# Patient Record
Sex: Female | Born: 1960 | Race: White | Hispanic: No | Marital: Single | Smoking: Current every day smoker
Health system: Southern US, Community
[De-identification: ages and names within clinical notes are randomized; demographics above are authoritative.]

## PROBLEM LIST (undated history)

## (undated) DIAGNOSIS — M1990 Unspecified osteoarthritis, unspecified site: Secondary | ICD-10-CM

## (undated) DIAGNOSIS — J45909 Unspecified asthma, uncomplicated: Secondary | ICD-10-CM

## (undated) DIAGNOSIS — C801 Malignant (primary) neoplasm, unspecified: Secondary | ICD-10-CM

## (undated) DIAGNOSIS — F419 Anxiety disorder, unspecified: Secondary | ICD-10-CM

## (undated) DIAGNOSIS — I219 Acute myocardial infarction, unspecified: Secondary | ICD-10-CM

## (undated) DIAGNOSIS — IMO0002 Reserved for concepts with insufficient information to code with codable children: Secondary | ICD-10-CM

## (undated) DIAGNOSIS — E559 Vitamin D deficiency, unspecified: Secondary | ICD-10-CM

## (undated) DIAGNOSIS — I10 Essential (primary) hypertension: Secondary | ICD-10-CM

## (undated) DIAGNOSIS — M25559 Pain in unspecified hip: Secondary | ICD-10-CM

## (undated) DIAGNOSIS — L853 Xerosis cutis: Secondary | ICD-10-CM

## (undated) DIAGNOSIS — E119 Type 2 diabetes mellitus without complications: Secondary | ICD-10-CM

## (undated) DIAGNOSIS — J449 Chronic obstructive pulmonary disease, unspecified: Secondary | ICD-10-CM

## (undated) DIAGNOSIS — R9431 Abnormal electrocardiogram [ECG] [EKG]: Secondary | ICD-10-CM

## (undated) DIAGNOSIS — G8929 Other chronic pain: Secondary | ICD-10-CM

## (undated) DIAGNOSIS — E785 Hyperlipidemia, unspecified: Secondary | ICD-10-CM

## (undated) DIAGNOSIS — T7840XA Allergy, unspecified, initial encounter: Secondary | ICD-10-CM

## (undated) HISTORY — DX: Abnormal electrocardiogram (ECG) (EKG): R94.31

## (undated) HISTORY — DX: Type 2 diabetes mellitus without complications: E11.9

## (undated) HISTORY — DX: Xerosis cutis: L85.3

## (undated) HISTORY — DX: Chronic obstructive pulmonary disease, unspecified: J44.9

## (undated) HISTORY — DX: Hyperlipidemia, unspecified: E78.5

## (undated) HISTORY — DX: Unspecified asthma, uncomplicated: J45.909

## (undated) HISTORY — DX: Acute myocardial infarction, unspecified: I21.9

## (undated) HISTORY — DX: Essential (primary) hypertension: I10

## (undated) HISTORY — DX: Allergy, unspecified, initial encounter: T78.40XA

## (undated) HISTORY — DX: Anxiety disorder, unspecified: F41.9

## (undated) HISTORY — PX: STERIOD INJECTION: SHX5046

## (undated) HISTORY — PX: TUBAL LIGATION: SHX77

## (undated) HISTORY — DX: Reserved for concepts with insufficient information to code with codable children: IMO0002

## (undated) HISTORY — DX: Malignant (primary) neoplasm, unspecified: C80.1

## (undated) HISTORY — DX: Vitamin D deficiency, unspecified: E55.9

## (undated) HISTORY — DX: Unspecified osteoarthritis, unspecified site: M19.90

---

## 2009-09-19 HISTORY — PX: UPPER GASTROINTESTINAL ENDOSCOPY: SHX188

## 2010-01-13 ENCOUNTER — Emergency Department (HOSPITAL_COMMUNITY): Admission: EM | Admit: 2010-01-13 | Discharge: 2010-01-13 | Payer: Self-pay | Admitting: Emergency Medicine

## 2010-01-18 ENCOUNTER — Ambulatory Visit: Payer: Self-pay | Admitting: Family Medicine

## 2010-02-05 ENCOUNTER — Ambulatory Visit (HOSPITAL_COMMUNITY): Admission: RE | Admit: 2010-02-05 | Discharge: 2010-02-05 | Payer: Self-pay | Admitting: Internal Medicine

## 2010-02-28 ENCOUNTER — Ambulatory Visit: Payer: Self-pay | Admitting: Internal Medicine

## 2010-02-28 ENCOUNTER — Encounter (INDEPENDENT_AMBULATORY_CARE_PROVIDER_SITE_OTHER): Payer: Self-pay | Admitting: Family Medicine

## 2010-02-28 LAB — CONVERTED CEMR LAB
ALT: 23 units/L (ref 0–35)
AST: 14 units/L (ref 0–37)
Albumin: 4.5 g/dL (ref 3.5–5.2)
Alkaline Phosphatase: 65 units/L (ref 39–117)
BUN: 14 mg/dL (ref 6–23)
Basophils Absolute: 0 10*3/uL (ref 0.0–0.1)
Basophils Relative: 1 % (ref 0–1)
CO2: 26 meq/L (ref 19–32)
Calcium: 9.9 mg/dL (ref 8.4–10.5)
Casts: NONE SEEN /lpf
Chloride: 105 meq/L (ref 96–112)
Cholesterol: 187 mg/dL (ref 0–200)
Creatinine, Ser: 0.9 mg/dL (ref 0.40–1.20)
Crystals: NONE SEEN
Eosinophils Absolute: 0.1 10*3/uL (ref 0.0–0.7)
Eosinophils Relative: 1 % (ref 0–5)
Glucose, Bld: 100 mg/dL — ABNORMAL HIGH (ref 70–99)
HCT: 48.6 % — ABNORMAL HIGH (ref 36.0–46.0)
HDL: 45 mg/dL (ref >39)
Hemoglobin: 15.6 g/dL — ABNORMAL HIGH (ref 12.0–15.0)
Hgb A1c MFr Bld: 5.7 % — ABNORMAL HIGH (ref <5.7)
LDL Cholesterol: 101 mg/dL — ABNORMAL HIGH (ref 0–99)
Lymphocytes Relative: 22 % (ref 12–46)
Lymphs Abs: 1.7 10*3/uL (ref 0.7–4.0)
MCHC: 32.1 g/dL (ref 30.0–36.0)
MCV: 96 fL (ref 78.0–100.0)
Monocytes Absolute: 0.5 10*3/uL (ref 0.1–1.0)
Monocytes Relative: 6 % (ref 3–12)
Neutro Abs: 5.7 10*3/uL (ref 1.7–7.7)
Neutrophils Relative %: 71 % (ref 43–77)
Platelets: 193 10*3/uL (ref 150–400)
Potassium: 4.3 meq/L (ref 3.5–5.3)
RBC / HPF: NONE SEEN (ref <3)
RBC: 5.06 M/uL (ref 3.87–5.11)
RDW: 13.5 % (ref 11.5–15.5)
Sodium: 141 meq/L (ref 135–145)
TSH: 1.816 microintl units/mL (ref 0.350–4.500)
Total Bilirubin: 0.5 mg/dL (ref 0.3–1.2)
Total CHOL/HDL Ratio: 4.2
Total Protein: 7.2 g/dL (ref 6.0–8.3)
Triglycerides: 207 mg/dL — ABNORMAL HIGH (ref <150)
VLDL: 41 mg/dL — ABNORMAL HIGH (ref 0–40)
WBC, UA: NONE SEEN cells/hpf (ref <3)
WBC: 8 10*3/uL (ref 4.0–10.5)

## 2010-03-01 ENCOUNTER — Encounter (INDEPENDENT_AMBULATORY_CARE_PROVIDER_SITE_OTHER): Payer: Self-pay | Admitting: Family Medicine

## 2010-05-14 ENCOUNTER — Encounter (INDEPENDENT_AMBULATORY_CARE_PROVIDER_SITE_OTHER): Payer: Self-pay | Admitting: Family Medicine

## 2010-05-14 LAB — CONVERTED CEMR LAB
ALT: 28 units/L (ref 0–35)
AST: 18 units/L (ref 0–37)
Albumin: 4.5 g/dL (ref 3.5–5.2)
Alkaline Phosphatase: 89 units/L (ref 39–117)
BUN: 21 mg/dL (ref 6–23)
CO2: 27 meq/L (ref 19–32)
Calcium: 10.3 mg/dL (ref 8.4–10.5)
Chloride: 106 meq/L (ref 96–112)
Cholesterol: 164 mg/dL (ref 0–200)
Creatinine, Ser: 0.91 mg/dL (ref 0.40–1.20)
Glucose, Bld: 116 mg/dL — ABNORMAL HIGH (ref 70–99)
HDL: 42 mg/dL (ref >39)
LDL Cholesterol: 101 mg/dL — ABNORMAL HIGH (ref 0–99)
Potassium: 5 meq/L (ref 3.5–5.3)
Sodium: 142 meq/L (ref 135–145)
Total Bilirubin: 0.7 mg/dL (ref 0.3–1.2)
Total CHOL/HDL Ratio: 3.9
Total Protein: 7.3 g/dL (ref 6.0–8.3)
Triglycerides: 106 mg/dL (ref <150)
VLDL: 21 mg/dL (ref 0–40)

## 2010-06-19 ENCOUNTER — Ambulatory Visit: Payer: Self-pay | Admitting: Internal Medicine

## 2010-07-16 ENCOUNTER — Encounter (INDEPENDENT_AMBULATORY_CARE_PROVIDER_SITE_OTHER): Payer: Self-pay | Admitting: *Deleted

## 2010-07-16 LAB — CONVERTED CEMR LAB: Microalb, Ur: 0.5 mg/dL (ref 0.00–1.89)

## 2011-05-23 ENCOUNTER — Other Ambulatory Visit: Payer: Self-pay | Admitting: Family Medicine

## 2011-05-23 ENCOUNTER — Other Ambulatory Visit (HOSPITAL_COMMUNITY)
Admission: RE | Admit: 2011-05-23 | Discharge: 2011-05-23 | Disposition: A | Discharge status: Home / Self Care | Payer: Medicaid Other | Source: Ambulatory Visit | Attending: Family Medicine | Admitting: Family Medicine

## 2011-05-23 DIAGNOSIS — R319 Hematuria, unspecified: Secondary | ICD-10-CM | POA: Insufficient documentation

## 2011-06-14 ENCOUNTER — Other Ambulatory Visit (HOSPITAL_COMMUNITY): Payer: Self-pay | Admitting: Family Medicine

## 2011-06-21 ENCOUNTER — Ambulatory Visit (HOSPITAL_COMMUNITY)
Admission: RE | Admit: 2011-06-21 | Discharge: 2011-06-21 | Disposition: A | Discharge status: Home / Self Care | Payer: Self-pay | Source: Ambulatory Visit | Attending: Family Medicine | Admitting: Family Medicine

## 2011-06-21 DIAGNOSIS — R319 Hematuria, unspecified: Secondary | ICD-10-CM | POA: Insufficient documentation

## 2012-08-19 HISTORY — PX: MULTIPLE TOOTH EXTRACTIONS: SHX2053

## 2015-01-13 ENCOUNTER — Ambulatory Visit (INDEPENDENT_AMBULATORY_CARE_PROVIDER_SITE_OTHER): Payer: Medicaid Other | Admitting: Podiatry

## 2015-01-13 ENCOUNTER — Encounter: Payer: Self-pay | Admitting: Podiatry

## 2015-01-13 VITALS — BP 116/74 | HR 69 | Temp 99.0°F | Resp 14

## 2015-01-13 DIAGNOSIS — M7989 Other specified soft tissue disorders: Secondary | ICD-10-CM | POA: Diagnosis not present

## 2015-01-13 DIAGNOSIS — M79669 Pain in unspecified lower leg: Secondary | ICD-10-CM | POA: Diagnosis not present

## 2015-01-13 DIAGNOSIS — M79676 Pain in unspecified toe(s): Secondary | ICD-10-CM

## 2015-01-13 DIAGNOSIS — B351 Tinea unguium: Secondary | ICD-10-CM

## 2015-01-13 DIAGNOSIS — L600 Ingrowing nail: Secondary | ICD-10-CM

## 2015-01-13 NOTE — Progress Notes (Signed)
   Subjective:    Patient ID: Victoria Rose, female    DOB: [birth year], 54 y.o.   MRN: [id number]  HPI Patient presents today with left great toe (medial side), since 2 months ago, gradual appearing. No treatments done.   Review of Systems  Constitutional: Positive for unexpected weight change.       Sweating  Eyes: Positive for visual disturbance.  Musculoskeletal: Positive for back pain.       Joint pain       Objective:   Physical Exam GENERAL APPEARANCE: Alert, conversant. Appropriately groomed. No acute distress.  VASCULAR: Pedal pulses palpable at 2/4 DP and PT bilateral.  Capillary refill time is immediate to all digits,  Proximal to distal cooling it warm to warm.  Digital hair growth is present bilateral  NEUROLOGIC: sensation is intact epicritically and protectively to 5.07 monofilament at 5/5 sites bilateral.  Light touch is intact bilateral, vibratory sensation intact bilateral, achilles tendon reflex is intact bilateral.  MUSCULOSKELETAL: acceptable muscle strength, tone and stability bilateral.  Intrinsic muscluature intact bilateral.  Rectus appearance of foot and digits noted bilateral.   DERMATOLOGIC: skin color, texture, and turger are within normal limits.  No preulcerative lesions are seen, no interdigital maceration noted.  No open lesions present.  Digital nails are asymptomatic.   NAIL;  Patient has thick, disfigured, discolored nails 1-5 bilat. Marked incurvation noted. Pincer nails hallux bilateral.  Redness and swelling medial border left halux      Assessment & Plan:  Ingrown nail left hallux, onychomycosis x 10  Treatment options and alternatives discussed.  Recommended permanent phenol matr ixectomy and patient agreed.  left was prepped with alcohol and a 1 to 1 mix of 0.5% marcaine plain and 2% lidocaine plain was administered in a digital block fashion.  The toe was then prepped with betadine solution and exsanguinated.  The offending nail border  was then excised and matrix tissue exposed.  Phenol was then applied to the matrix tissue followed by an alcohol wash.  Antibiotic ointment and a dry sterile dressing was applied.  The patient was dispensed instructions for aftercare.  Remainder of mycotic nails debrided x 10.

## 2015-01-19 ENCOUNTER — Encounter: Payer: Self-pay | Admitting: Podiatry

## 2015-01-19 ENCOUNTER — Ambulatory Visit (INDEPENDENT_AMBULATORY_CARE_PROVIDER_SITE_OTHER): Payer: Medicaid Other | Admitting: Podiatry

## 2015-01-19 DIAGNOSIS — Z09 Encounter for follow-up examination after completed treatment for conditions other than malignant neoplasm: Secondary | ICD-10-CM

## 2015-01-19 NOTE — Progress Notes (Signed)
Subjective:    Patient ID: Victoria Rose, female   DOB: September 14, 1960, 54 y.o.   MRN: [id number]  HPI   Patient presents to the office saying her nail is markedly better and her pain is gone.  She has soaked and bandaged her toe as described.  She is pleased with progress.  Review of Systems   Objective:  Physical Exam   No signs of redness or swelling or drainage from site of nail surgery. No palpable pain noted.   Assessment:    Healing nail Surgery   Plan:   Continue home instructions.  Call as needed.

## 2015-01-19 NOTE — Progress Notes (Signed)
Patient ID: Victoria Rose, female   DOB: 12/19/1960, 54 y.o.   MRN: [id number]

## 2015-04-21 ENCOUNTER — Other Ambulatory Visit: Payer: Self-pay | Admitting: Physician Assistant

## 2015-04-21 ENCOUNTER — Ambulatory Visit
Admission: RE | Admit: 2015-04-21 | Discharge: 2015-04-21 | Disposition: A | Discharge status: Home / Self Care | Payer: Medicaid Other | Source: Ambulatory Visit | Attending: Physician Assistant | Admitting: Physician Assistant

## 2015-04-21 DIAGNOSIS — R52 Pain, unspecified: Secondary | ICD-10-CM

## 2015-07-24 DIAGNOSIS — F419 Anxiety disorder, unspecified: Secondary | ICD-10-CM

## 2015-07-24 HISTORY — DX: Anxiety disorder, unspecified: F41.9

## 2015-07-28 DIAGNOSIS — E559 Vitamin D deficiency, unspecified: Secondary | ICD-10-CM | POA: Insufficient documentation

## 2015-10-19 ENCOUNTER — Emergency Department (HOSPITAL_COMMUNITY): Payer: Medicaid Other

## 2015-10-19 ENCOUNTER — Emergency Department (HOSPITAL_COMMUNITY)
Admission: EM | Admit: 2015-10-19 | Discharge: 2015-10-19 | Disposition: A | Discharge status: Home / Self Care | Payer: Medicaid Other | Attending: Emergency Medicine | Admitting: Emergency Medicine

## 2015-10-19 ENCOUNTER — Encounter (HOSPITAL_COMMUNITY): Payer: Self-pay | Admitting: *Deleted

## 2015-10-19 DIAGNOSIS — R531 Weakness: Secondary | ICD-10-CM | POA: Diagnosis present

## 2015-10-19 DIAGNOSIS — Z79899 Other long term (current) drug therapy: Secondary | ICD-10-CM | POA: Diagnosis not present

## 2015-10-19 DIAGNOSIS — I959 Hypotension, unspecified: Secondary | ICD-10-CM | POA: Insufficient documentation

## 2015-10-19 DIAGNOSIS — E785 Hyperlipidemia, unspecified: Secondary | ICD-10-CM | POA: Diagnosis not present

## 2015-10-19 DIAGNOSIS — R55 Syncope and collapse: Secondary | ICD-10-CM

## 2015-10-19 DIAGNOSIS — E119 Type 2 diabetes mellitus without complications: Secondary | ICD-10-CM | POA: Insufficient documentation

## 2015-10-19 DIAGNOSIS — M169 Osteoarthritis of hip, unspecified: Secondary | ICD-10-CM | POA: Diagnosis not present

## 2015-10-19 DIAGNOSIS — I10 Essential (primary) hypertension: Secondary | ICD-10-CM | POA: Diagnosis not present

## 2015-10-19 DIAGNOSIS — F17210 Nicotine dependence, cigarettes, uncomplicated: Secondary | ICD-10-CM | POA: Diagnosis not present

## 2015-10-19 DIAGNOSIS — Z7984 Long term (current) use of oral hypoglycemic drugs: Secondary | ICD-10-CM | POA: Diagnosis not present

## 2015-10-19 DIAGNOSIS — G8929 Other chronic pain: Secondary | ICD-10-CM | POA: Diagnosis not present

## 2015-10-19 HISTORY — DX: Pain in unspecified hip: M25.559

## 2015-10-19 HISTORY — DX: Other chronic pain: G89.29

## 2015-10-19 LAB — URINE MICROSCOPIC-ADD ON

## 2015-10-19 LAB — CBG MONITORING, ED: Glucose-Capillary: 152 mg/dL — ABNORMAL HIGH (ref 65–99)

## 2015-10-19 LAB — BASIC METABOLIC PANEL
Anion gap: 17 — ABNORMAL HIGH (ref 5–15)
BUN: 17 mg/dL (ref 6–20)
CO2: 21 mmol/L — ABNORMAL LOW (ref 22–32)
Calcium: 9.8 mg/dL (ref 8.9–10.3)
Chloride: 108 mmol/L (ref 101–111)
Creatinine, Ser: 0.9 mg/dL (ref 0.44–1.00)
GFR calc Af Amer: >60 mL/min (ref >60)
GFR calc non Af Amer: >60 mL/min (ref >60)
Glucose, Bld: 180 mg/dL — ABNORMAL HIGH (ref 65–99)
Potassium: 3.7 mmol/L (ref 3.5–5.1)
Sodium: 146 mmol/L — ABNORMAL HIGH (ref 135–145)

## 2015-10-19 LAB — URINALYSIS, ROUTINE W REFLEX MICROSCOPIC
Bilirubin Urine: NEGATIVE
Glucose, UA: NEGATIVE mg/dL
Hgb urine dipstick: NEGATIVE
Ketones, ur: NEGATIVE mg/dL
Leukocytes, UA: NEGATIVE
Nitrite: NEGATIVE
Protein, ur: 30 mg/dL — AB
Specific Gravity, Urine: 1.023 (ref 1.005–1.030)
pH: 5.5 (ref 5.0–8.0)

## 2015-10-19 LAB — CBC
HCT: 50.2 % — ABNORMAL HIGH (ref 36.0–46.0)
Hemoglobin: 16.3 g/dL — ABNORMAL HIGH (ref 12.0–15.0)
MCH: 29.8 pg (ref 26.0–34.0)
MCHC: 32.5 g/dL (ref 30.0–36.0)
MCV: 91.8 fL (ref 78.0–100.0)
Platelets: 294 10*3/uL (ref 150–400)
RBC: 5.47 MIL/uL — ABNORMAL HIGH (ref 3.87–5.11)
RDW: 14.4 % (ref 11.5–15.5)
WBC: 14.6 10*3/uL — ABNORMAL HIGH (ref 4.0–10.5)

## 2015-10-19 LAB — D-DIMER, QUANTITATIVE: D-Dimer, Quant: 1.05 ug/mL-FEU — ABNORMAL HIGH (ref 0.00–0.50)

## 2015-10-19 MED ORDER — IOHEXOL 350 MG/ML SOLN
80.0000 mL | Freq: Once | INTRAVENOUS | Status: AC | PRN
  Administered 2015-10-19: 80 mL via INTRAVENOUS

## 2015-10-19 MED ORDER — SODIUM CHLORIDE 0.9 % IV BOLUS (SEPSIS)
1000.0000 mL | Freq: Once | INTRAVENOUS | Status: AC
  Administered 2015-10-19: 1000 mL via INTRAVENOUS

## 2015-10-19 NOTE — ED Provider Notes (Signed)
CSN: [id number]     Arrival date & time 10/19/15  1719 History   First MD Initiated Contact with Patient 10/19/15 1846     Chief Complaint  Patient presents with  . Fall  . Weakness  . Dizziness     (Consider location/radiation/quality/duration/timing/severity/associated sxs/prior Treatment) HPI  55 year old female presents with syncope. Patient states she was walking from bathroom to living room and passed out suddenly. She had been walking for about 40 feet. No lightheadedness. No CP, dyspnea or headache before or after. No vomiting or diarrhea. Has not been ill recently. No urinary symptoms. No cough or fever. Did not hit head. Father said she was out for a few seconds. Does not remember having any palpitations.   Past Medical History  Diagnosis Date  . Diabetes mellitus without complication ([place])   . Hyperlipidemia   . Hypertension   . Arthritis     hip  . Chronic hip pain    History reviewed. No pertinent past surgical history. History reviewed. No pertinent family history. Social History  Substance Use Topics  . Smoking status: Current Every Day Smoker -- 0.50 packs/day    Types: Cigarettes  . Smokeless tobacco: Never Used  . Alcohol Use: 0.0 oz/week    0 Standard drinks or equivalent per week     Comment: occasionally   OB History    No data available     Review of Systems  Constitutional: Negative for fever.  Respiratory: Negative for cough and shortness of breath.   Cardiovascular: Negative for chest pain.  Gastrointestinal: Negative for nausea, vomiting and diarrhea.  Genitourinary: Negative for dysuria.  Neurological: Positive for syncope. Negative for light-headedness and headaches.  All other systems reviewed and are negative.     Allergies  Aspirin and Nsaids  Home Medications   Prior to Admission medications   Medication Sig Start Date End Date Taking? Authorizing Provider  amitriptyline (ELAVIL) 50 MG tablet  01/05/15   Historical Provider, MD   atorvastatin (LIPITOR) 20 MG tablet Take 20 mg by mouth daily. 10/28/14   Historical Provider, MD  citalopram (CELEXA) 40 MG tablet  12/19/14   Historical Provider, MD  cyclobenzaprine (FLEXERIL) 10 MG tablet  12/14/14   Historical Provider, MD  HYDROcodone-acetaminophen [facility]) 10-325 MG per tablet  12/20/14   Historical Provider, MD  lisinopril-hydrochlorothiazide (PRINZIDE,ZESTORETIC) 20-25 MG per tablet  12/19/14   Historical Provider, MD  LORazepam (ATIVAN) 1 MG tablet  12/19/14   Historical Provider, MD  metFORMIN (GLUCOPHAGE) 500 MG tablet  12/19/14   Historical Provider, MD  metoprolol (LOPRESSOR) 50 MG tablet  12/19/14   Historical Provider, MD  PROAIR HFA 108 (90 BASE) MCG/ACT inhaler  01/05/15   Historical Provider, MD   BP 101/66 mmHg  Pulse 62  Temp(Src) 98.7 F (37.1 C) (Oral)  Resp 27  Ht 5\' 4"  (1.626 m)  Wt 158 lb (71.668 kg)  BMI 27.11 kg/m2  SpO2 92% Physical Exam  Constitutional: She is oriented to person, place, and time. She appears well-developed and well-nourished.  HENT:  Head: Normocephalic and atraumatic.  Right Ear: External ear normal.  Left Ear: External ear normal.  Nose: Nose normal.  Eyes: Right eye exhibits no discharge. Left eye exhibits no discharge.  Cardiovascular: Normal rate, regular rhythm and normal heart sounds.   Pulmonary/Chest: Effort normal and breath sounds normal.  Abdominal: Soft. She exhibits no distension. There is no tenderness.  Neurological: She is alert and oriented to person, place, and time.  CN 2-12 grossly intact. 5/5 strength in all 4 extremities. Normal finger to nose  Skin: Skin is warm and dry.  Nursing note and vitals reviewed.   ED Course  Procedures (including critical care time) Labs Review Labs Reviewed  BASIC METABOLIC PANEL - Abnormal; Notable for the following:    Sodium 146 (*)    CO2 21 (*)    Glucose, Bld 180 (*)    Anion gap 17 (*)    All other components within normal limits  CBC - Abnormal; Notable for the  following:    WBC 14.6 (*)    RBC 5.47 (*)    Hemoglobin 16.3 (*)    HCT 50.2 (*)    All other components within normal limits  URINALYSIS, ROUTINE W REFLEX MICROSCOPIC (NOT AT [facility]) - Abnormal; Notable for the following:    APPearance CLOUDY (*)    Protein, ur 30 (*)    All other components within normal limits  D-DIMER, QUANTITATIVE (NOT AT [facility]) - Abnormal; Notable for the following:    D-Dimer, Quant 1.05 (*)    All other components within normal limits  URINE MICROSCOPIC-ADD ON - Abnormal; Notable for the following:    Squamous Epithelial / LPF 0-5 (*)    Bacteria, UA RARE (*)    Casts HYALINE CASTS (*)    All other components within normal limits  CBG MONITORING, ED - Abnormal; Notable for the following:    Glucose-Capillary 152 (*)    All other components within normal limits    Imaging Review Dg Chest 2 View  10/19/2015  CLINICAL DATA:  Syncopal episode EXAM: CHEST  2 VIEW COMPARISON:  None. FINDINGS: The heart size and mediastinal contours are within normal limits. Both lungs are clear. The visualized skeletal structures are unremarkable. IMPRESSION: No active cardiopulmonary disease. Electronically Signed   By: Inez Catalina M.D.   On: 10/19/2015 20:18   Ct Angio Chest Pe W/cm &/or Wo Cm  10/19/2015  CLINICAL DATA:  Syncopal episodes with elevated D-dimer EXAM: CT ANGIOGRAPHY CHEST WITH CONTRAST TECHNIQUE: Multidetector CT imaging of the chest was performed using the standard protocol during bolus administration of intravenous contrast. Multiplanar CT image reconstructions and MIPs were obtained to evaluate the vascular anatomy. CONTRAST:  45mL OMNIPAQUE IOHEXOL 350 MG/ML SOLN COMPARISON:  None. FINDINGS: The lungs are well aerated bilaterally. No focal infiltrate or sizable effusion is seen. Minimal dependent atelectatic changes are noted particularly on the right. The thoracic inlet is within normal limits. The thoracic aorta and its branches are within normal limits. The  pulmonary artery is well visualized and demonstrates a normal branching pattern. No intraluminal filling defects to suggest pulmonary emboli are identified. No significant hilar or mediastinal adenopathy is seen. Minimal coronary calcifications are noted. The visualized upper abdomen shows no acute abnormality. The osseous structures show no acute abnormality. Review of the MIP images confirms the above findings. IMPRESSION: No evidence of pulmonary emboli. Minimal dependent atelectatic changes. Electronically Signed   By: Inez Catalina M.D.   On: 10/19/2015 21:50   I have personally reviewed and evaluated these images and lab results as part of my medical decision-making.   EKG Interpretation   Date/Time:  Thursday October 19 2015 18:02:40 EST Ventricular Rate:  65 PR Interval:  122 QRS Duration: 84 QT Interval:  428 QTC Calculation: 445 R Axis:   52 Text Interpretation:  Normal sinus rhythm Normal ECG No old tracing to  compare Confirmed by Kohei Antonellis  MD, [place] [phone]) on 10/19/2015  6:17:05 PM      MDM   Final diagnoses:  Syncope, unspecified syncope type  Hypotension, unspecified hypotension type    Patient syncope was likely due to low blood pressure. Unclear exactly why she had low blood pressure although will hold her antihypertensives. Her heart rate has remained stable here. Given initial oxygen saturations of 92% a workup for PE was obtained and is negative. Her neuro exam is unremarkable. She feels completely fine now. Initial nurse report indicates she was dizzy and has been expressing weakness below and I discussed this with patient she denies this. He only symptom was the syncope. She feels quite well now. Blood pressure is normal after IV fluids. No signs of infection or sepsis on history or physical. I offered observation admission for telemetry monitoring but patient declines. She is not have any high risk features for a dysrhythmia such as CHF. She wants to go home and will call  her PCP about her high blood pressure medicines. Discussed strict return precautions.    Sherwood Gambler, MD 10/20/15 [phone]

## 2015-10-19 NOTE — Discharge Instructions (Signed)
Do not take your lisinopril-hydrochlorothiazide (PRINZIDE,ZESTORETIC) or metoprolol (LOPRESSOR) tomorrow. Check your blood pressure and call your doctor in the morning for close follow up and blood pressure instructions. If you pass out again, feel palpitations, chest pain, or other concerning symptoms then return to the ER immediately.

## 2015-10-19 NOTE — ED Notes (Signed)
Patient transported to X-ray 

## 2015-10-19 NOTE — ED Notes (Signed)
Pt reports that she "fell out" earlier today, pt denies LOC, states she felt dizzy/lightheaded and experiencing generalized weakness. Pt denies chest pain or shortness of breath. Pt admits to left ear discomfort as well.

## 2015-10-19 NOTE — ED Notes (Signed)
Pt taken to CT.

## 2016-12-27 ENCOUNTER — Other Ambulatory Visit: Payer: Self-pay | Admitting: Pain Medicine

## 2016-12-27 DIAGNOSIS — M545 Low back pain: Secondary | ICD-10-CM

## 2017-01-04 ENCOUNTER — Ambulatory Visit
Admission: RE | Admit: 2017-01-04 | Discharge: 2017-01-04 | Disposition: A | Discharge status: Home / Self Care | Payer: Medicaid Other | Source: Ambulatory Visit | Attending: Pain Medicine | Admitting: Pain Medicine

## 2017-01-04 DIAGNOSIS — M545 Low back pain: Secondary | ICD-10-CM

## 2017-04-18 IMAGING — CT CT ANGIO CHEST
2 of 6 series · 19 of 36 positions shown · IV contrast (Omni 300)
Comparison: None.

CLINICAL DATA: Syncopal episodes with elevated D-dimer

EXAM:
CT ANGIOGRAPHY CHEST WITH CONTRAST
TECHNIQUE: Multidetector CT imaging of the chest was performed using the
standard protocol during bolus administration of intravenous
contrast. Multiplanar CT image reconstructions and MIPs were
obtained to evaluate the vascular anatomy.
CONTRAST:  80mL OMNIPAQUE IOHEXOL 350 MG/ML SOLN

[Series 6: pe thins · axial · 0.62mm/px · z∈[+1783,+2020]mm · 18 of 524 slices shown]
[im 25/524  lung]
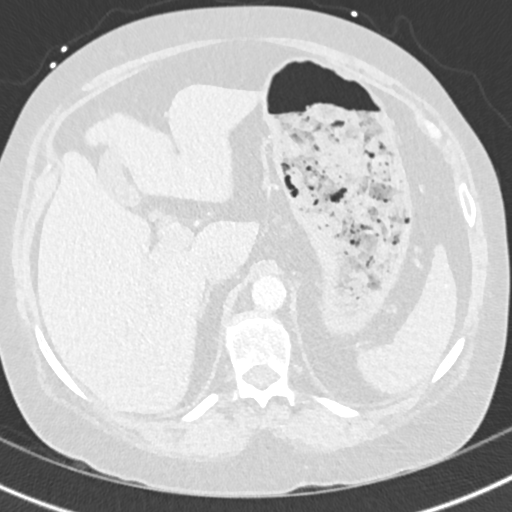
[im 50/524  mediastinal]
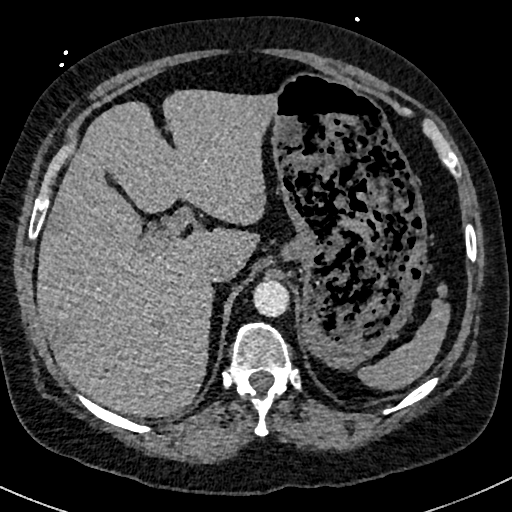
[im 75/524  lung]
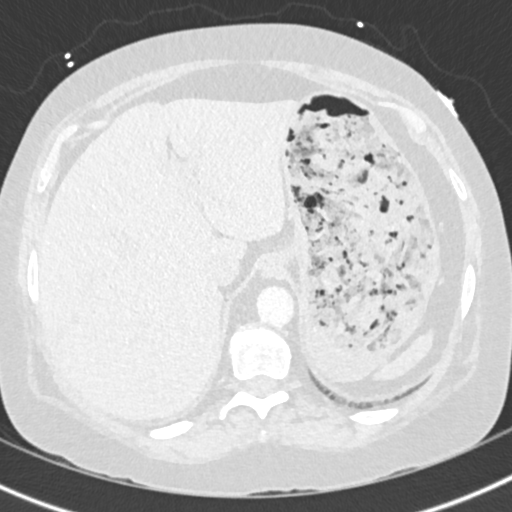
[im 100/524  mediastinal]
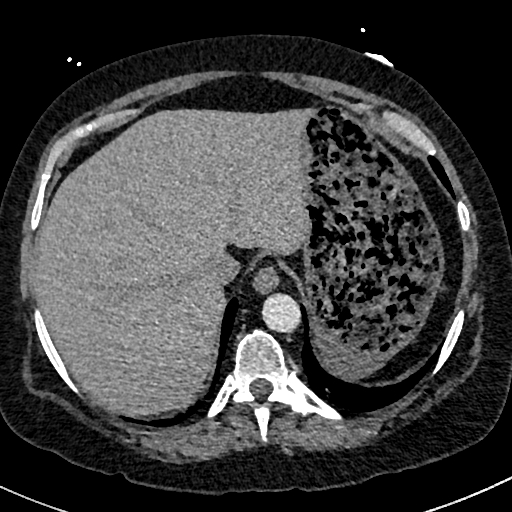
[im 150/524  lung]
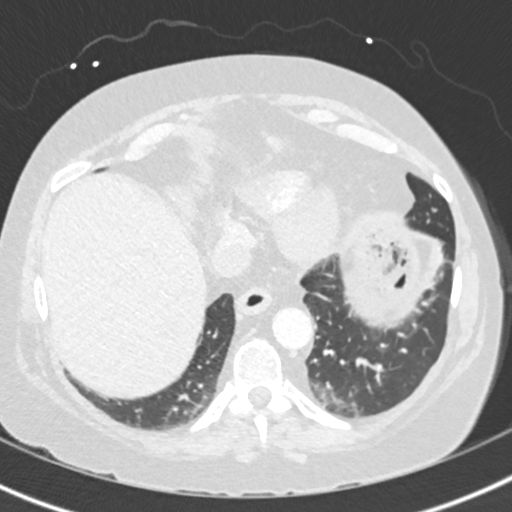
[im 175/524  mediastinal]
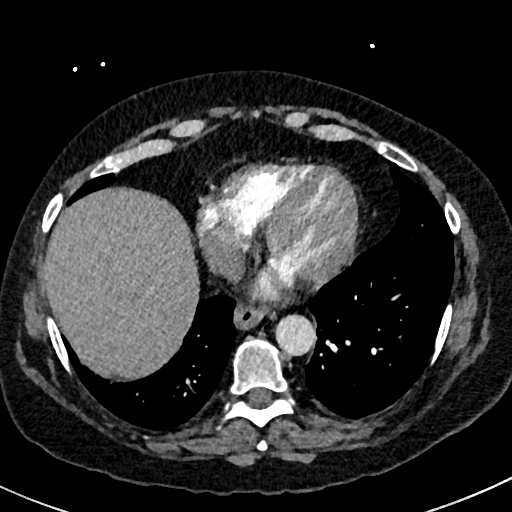
[im 200/524  lung]
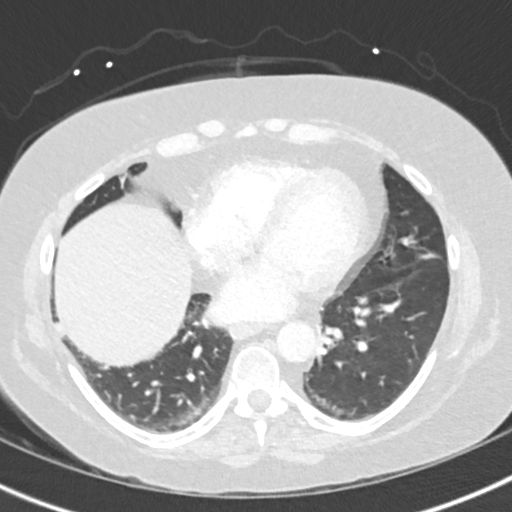
[im 225/524  mediastinal]
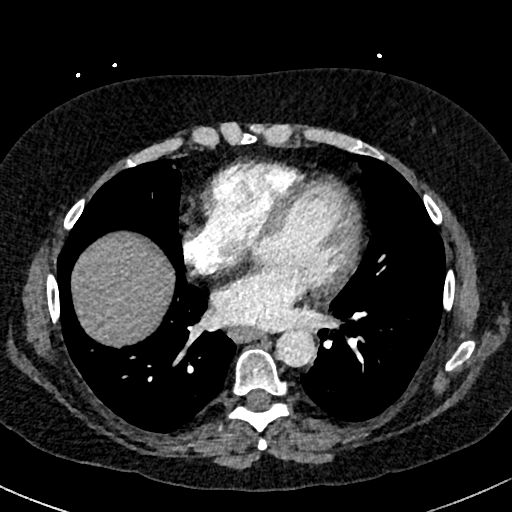
[im 250/524  lung]
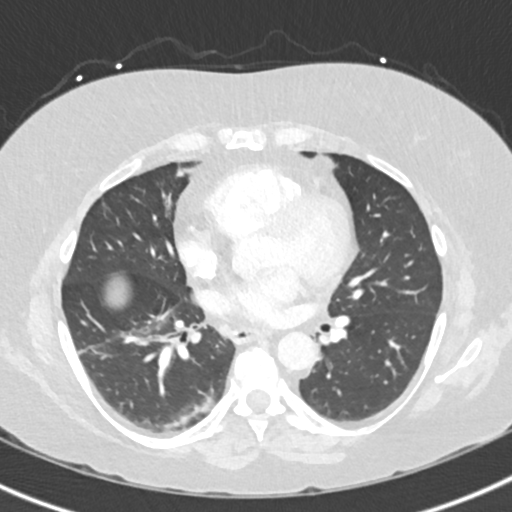
[im 274/524  mediastinal]
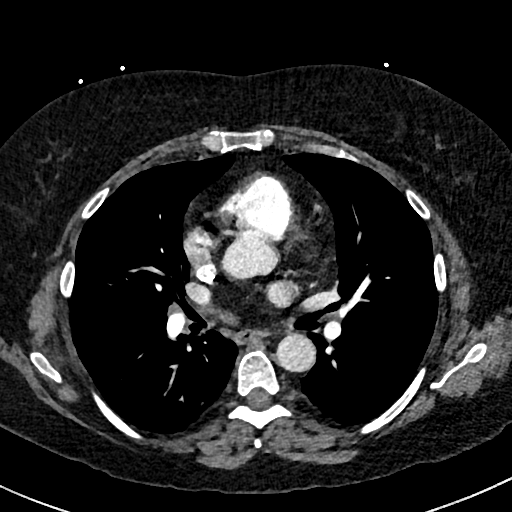
[im 299/524  lung]
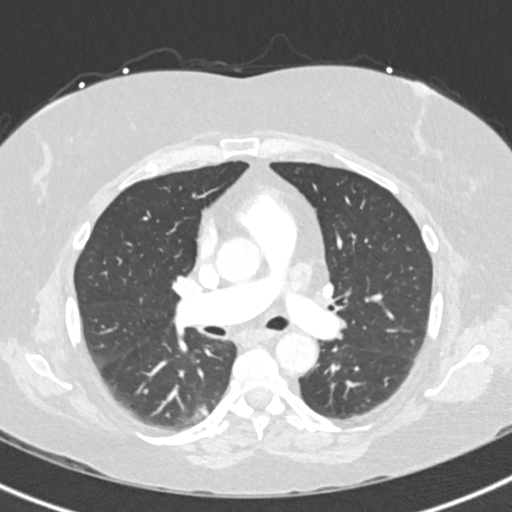
[im 324/524  mediastinal]
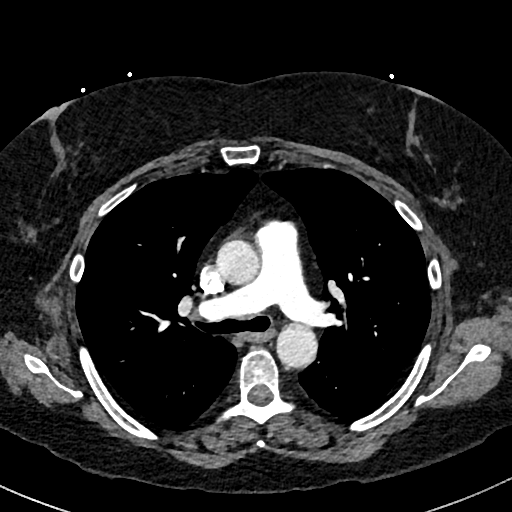
[im 349/524  lung]
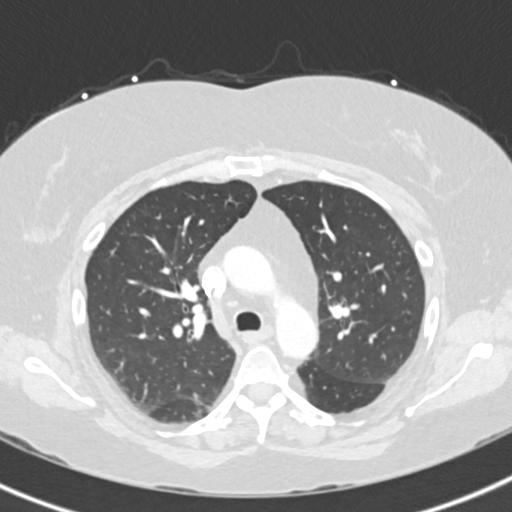
[im 399/524  mediastinal]
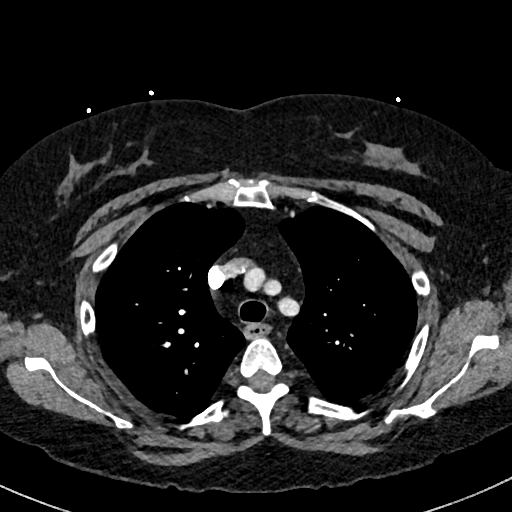
[im 424/524  lung]
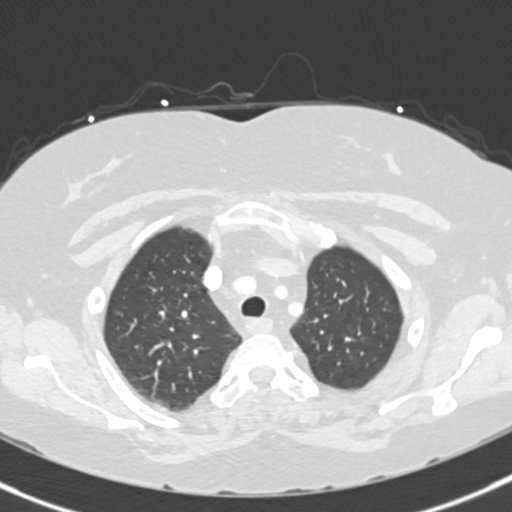
[im 449/524  mediastinal]
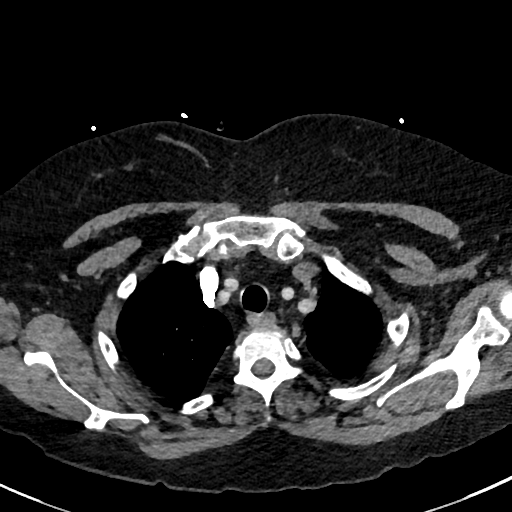
[im 474/524  lung]
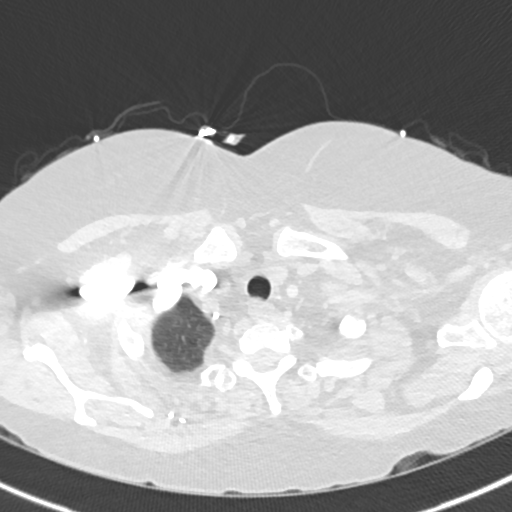
[im 499/524  mediastinal]
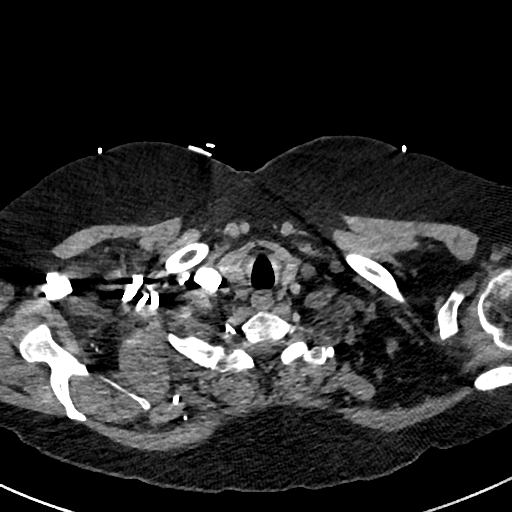

[Series 7: pe 2mm cor · coronal · 0.59mm/px · 1 of 109 slices shown]
[im 55/109  mediastinal]
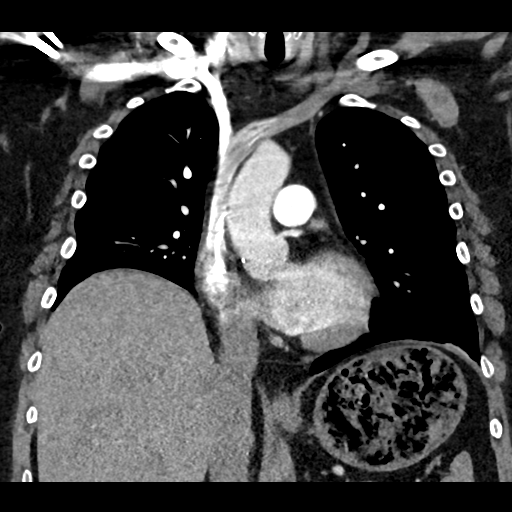

[19 of 36 positions shown; findings below may reference images not displayed]

FINDINGS: The lungs are well aerated bilaterally. No focal infiltrate or
sizable effusion is seen. Minimal dependent atelectatic changes are
noted particularly on the right.

The thoracic inlet is within normal limits. The thoracic aorta and
its branches are within normal limits. The pulmonary artery is well
visualized and demonstrates a normal branching pattern. No
intraluminal filling defects to suggest pulmonary emboli are
identified. No significant hilar or mediastinal adenopathy is seen.
Minimal coronary calcifications are noted.

The visualized upper abdomen shows no acute abnormality. The osseous
structures show no acute abnormality.

Review of the MIP images confirms the above findings.
IMPRESSION: No evidence of pulmonary emboli.

Minimal dependent atelectatic changes.

## 2017-10-15 ENCOUNTER — Encounter: Payer: Self-pay | Admitting: Gastroenterology

## 2017-12-01 ENCOUNTER — Other Ambulatory Visit: Payer: Self-pay

## 2017-12-01 ENCOUNTER — Ambulatory Visit (AMBULATORY_SURGERY_CENTER): Payer: Self-pay | Admitting: *Deleted

## 2017-12-01 VITALS — Ht 64.0 in | Wt 177.0 lb

## 2017-12-01 DIAGNOSIS — M7070 Other bursitis of hip, unspecified hip: Secondary | ICD-10-CM | POA: Insufficient documentation

## 2017-12-01 DIAGNOSIS — I10 Essential (primary) hypertension: Secondary | ICD-10-CM | POA: Insufficient documentation

## 2017-12-01 DIAGNOSIS — M4800 Spinal stenosis, site unspecified: Secondary | ICD-10-CM

## 2017-12-01 DIAGNOSIS — E781 Pure hyperglyceridemia: Secondary | ICD-10-CM | POA: Insufficient documentation

## 2017-12-01 DIAGNOSIS — Z1211 Encounter for screening for malignant neoplasm of colon: Secondary | ICD-10-CM

## 2017-12-01 DIAGNOSIS — E119 Type 2 diabetes mellitus without complications: Secondary | ICD-10-CM

## 2017-12-01 DIAGNOSIS — E785 Hyperlipidemia, unspecified: Secondary | ICD-10-CM | POA: Insufficient documentation

## 2017-12-01 DIAGNOSIS — R195 Other fecal abnormalities: Secondary | ICD-10-CM

## 2017-12-01 HISTORY — DX: Pure hyperglyceridemia: E78.1

## 2017-12-01 HISTORY — DX: Essential (primary) hypertension: I10

## 2017-12-01 HISTORY — DX: Type 2 diabetes mellitus without complications: E11.9

## 2017-12-01 HISTORY — DX: Other bursitis of hip, unspecified hip: M70.70

## 2017-12-01 HISTORY — DX: Spinal stenosis, site unspecified: M48.00

## 2017-12-01 MED ORDER — NA SULFATE-K SULFATE-MG SULF 17.5-3.13-1.6 GM/177ML PO SOLN: ORAL | 0 refills | Status: DC

## 2017-12-01 NOTE — Progress Notes (Signed)
Patient denies any allergies to eggs or soy. Patient denies any problems with anesthesia/sedation since 1988 C-section, pt states during that c-section "heart had to be shocked 3 times, they gave me too much anesthesia". Patient states she has received anesthesia since then without any problems. Asked Georgina Snell CRNA about this and he said she should be fine if she has had anesthesia without any problems after the c section.Patient denies any oxygen use at home. Patient denies taking any diet/weight loss medications or blood thinners. EMMI education assisgned to patient on colonoscopy, this was explained and instructions given to patient.

## 2017-12-15 ENCOUNTER — Ambulatory Visit (AMBULATORY_SURGERY_CENTER): Payer: Medicaid Other | Admitting: Gastroenterology

## 2017-12-15 ENCOUNTER — Encounter: Payer: Self-pay | Admitting: Gastroenterology

## 2017-12-15 VITALS — BP 115/74 | HR 72 | Temp 98.4°F | Resp 19 | Ht 64.0 in | Wt 177.0 lb

## 2017-12-15 DIAGNOSIS — Z1211 Encounter for screening for malignant neoplasm of colon: Secondary | ICD-10-CM

## 2017-12-15 DIAGNOSIS — K635 Polyp of colon: Secondary | ICD-10-CM | POA: Diagnosis not present

## 2017-12-15 DIAGNOSIS — D122 Benign neoplasm of ascending colon: Secondary | ICD-10-CM | POA: Diagnosis not present

## 2017-12-15 DIAGNOSIS — D123 Benign neoplasm of transverse colon: Secondary | ICD-10-CM

## 2017-12-15 DIAGNOSIS — D128 Benign neoplasm of rectum: Secondary | ICD-10-CM

## 2017-12-15 DIAGNOSIS — D125 Benign neoplasm of sigmoid colon: Secondary | ICD-10-CM

## 2017-12-15 DIAGNOSIS — D129 Benign neoplasm of anus and anal canal: Secondary | ICD-10-CM

## 2017-12-15 DIAGNOSIS — K621 Rectal polyp: Secondary | ICD-10-CM | POA: Diagnosis not present

## 2017-12-15 MED ORDER — SODIUM CHLORIDE 0.9 % IV SOLN: 500.0000 mL | Freq: Once | INTRAVENOUS | Status: DC

## 2017-12-15 NOTE — Progress Notes (Signed)
Called to room to assist during endoscopic procedure.  Patient ID and intended procedure confirmed with present staff. Received instructions for my participation in the procedure from the performing physician.  

## 2017-12-15 NOTE — Patient Instructions (Signed)
Impression/Recommendations:  Polyp handout given to patient. Diverticulosis handout given to patient.  Resume previous diet. Continue present medications.  Repeat colonsocopy recommended.  Date to be determined after pathology results reviewed.  YOU HAD AN ENDOSCOPIC PROCEDURE TODAY AT [facility]:   Refer to the procedure report that was given to you for any specific questions about what was found during the examination.  If the procedure report does not answer your questions, please call your gastroenterologist to clarify.  If you requested that your care partner not be given the details of your procedure findings, then the procedure report has been included in a sealed envelope for you to review at your convenience later.  YOU SHOULD EXPECT: Some feelings of bloating in the abdomen. Passage of more gas than usual.  Walking can help get rid of the air that was put into your GI tract during the procedure and reduce the bloating. If you had a lower endoscopy (such as a colonoscopy or flexible sigmoidoscopy) you may notice spotting of blood in your stool or on the toilet paper. If you underwent a bowel prep for your procedure, you may not have a normal bowel movement for a few days.  Please Note:  You might notice some irritation and congestion in your nose or some drainage.  This is from the oxygen used during your procedure.  There is no need for concern and it should clear up in a day or so.  SYMPTOMS TO REPORT IMMEDIATELY:   Following lower endoscopy (colonoscopy or flexible sigmoidoscopy):  Excessive amounts of blood in the stool  Significant tenderness or worsening of abdominal pains  Swelling of the abdomen that is new, acute  Fever of 100F or higher  For urgent or emergent issues, a gastroenterologist can be reached at any hour by calling [phone].   DIET:  We do recommend a small meal at first, but then you may proceed to your regular diet.  Drink plenty of  fluids but you should avoid alcoholic beverages for 24 hours.  ACTIVITY:  You should plan to take it easy for the rest of today and you should NOT DRIVE or use heavy machinery until tomorrow (because of the sedation medicines used during the test).    FOLLOW UP: Our staff will call the number listed on your records the next business day following your procedure to check on you and address any questions or concerns that you may have regarding the information given to you following your procedure. If we do not reach you, we will leave a message.  However, if you are feeling well and you are not experiencing any problems, there is no need to return our call.  We will assume that you have returned to your regular daily activities without incident.  If any biopsies were taken you will be contacted by phone or by letter within the next 1-3 weeks.  Please call us at [phone] if you have not heard about the biopsies in 3 weeks.    SIGNATURES/CONFIDENTIALITY: You and/or your care partner have signed paperwork which will be entered into your electronic medical record.  These signatures attest to the fact that that the information above on your After Visit Summary has been reviewed and is understood.  Full responsibility of the confidentiality of this discharge information lies with you and/or your care-partner.

## 2017-12-15 NOTE — Op Note (Signed)
[place] Patient Name: Victoria Rose Procedure Date: 12/15/2017 8:32 AM MRN: [id number] Endoscopist: Mauri Pole , MD Age: 57 Referring MD:  Date of Birth: Jun 15, 1961 Gender: Female Account #: [id number] Procedure:                Colonoscopy Indications:              Screening for colorectal malignant neoplasm, This                            is the patient's first colonoscopy Medicines:                Monitored Anesthesia Care Procedure:                Pre-Anesthesia Assessment:                           - Prior to the procedure, a History and Physical                            was performed, and patient medications and                            allergies were reviewed. The patient's tolerance of                            previous anesthesia was also reviewed. The risks                            and benefits of the procedure and the sedation                            options and risks were discussed with the patient.                            All questions were answered, and informed consent                            was obtained. Prior Anticoagulants: The patient has                            taken no previous anticoagulant or antiplatelet                            agents. ASA Grade Assessment: III - A patient with                            severe systemic disease. After reviewing the risks                            and benefits, the patient was deemed in                            satisfactory condition to undergo the procedure.  After obtaining informed consent, the colonoscope                            was passed under direct vision. Throughout the                            procedure, the patient's blood pressure, pulse, and                            oxygen saturations were monitored continuously. The                            Model PCF-[id number]) scope was introduced                            through the  anus and advanced to the the terminal                            ileum, with identification of the appendiceal                            orifice and IC valve. The colonoscopy was performed                            without difficulty. The patient tolerated the                            procedure well. The quality of the bowel                            preparation was good. The terminal ileum, ileocecal                            valve, appendiceal orifice, and rectum were                            photographed. Scope In: 8:38:32 AM Scope Out: 9:02:06 AM Scope Withdrawal Time: 0 hours 20 minutes 53 seconds  Total Procedure Duration: 0 hours 23 minutes 34 seconds  Findings:                 The perianal and digital rectal examinations were                            normal.                           A 8 mm polyp was found in the ascending colon. The                            polyp was sessile. The polyp was removed with a                            cold snare. Resection and retrieval were complete.  A 11 mm polyp was found in the transverse colon.                            The polyp was sessile. The polyp was removed with a                            cold snare. Resection and retrieval were complete.                            Coagulation for hemostasis of bleeding caused by                            the procedure using snare was unsuccessful. To                            prevent bleeding after the polypectomy, one                            hemostatic clip was successfully placed (MR                            conditional). There was no bleeding at the end of                            the procedure.                           A 20 mm polyp was found in the sigmoid colon. The                            polyp was sessile. The polyp was removed with a                            piecemeal technique using a hot snare. Resection                            and  retrieval were complete.                           Two hyperplastic appearing polyps were found in the                            rectum. The polyps were 2 to 5 mm in size. These                            polyps were removed with a cold biopsy forceps.                            Resection and retrieval were complete.                           Scattered small and large-mouthed diverticula were  found in the sigmoid colon and descending colon. Complications:            No immediate complications. Estimated Blood Loss:     Estimated blood loss was minimal. Impression:               - One 8 mm polyp in the ascending colon, removed                            with a cold snare. Resected and retrieved.                           - One 11 mm polyp in the transverse colon, removed                            with a cold snare. Resected and retrieved.                            Treatment not successful. Treated with a hot snare.                            Clip (MR conditional) was placed.                           - One 20 mm polyp in the sigmoid colon, removed                            piecemeal using a hot snare. Resected and retrieved.                           - Two 2 to 5 mm polyps in the rectum, removed with                            a cold biopsy forceps. Resected and retrieved.                           - Mild diverticulosis in the sigmoid colon and in                            the descending colon. Recommendation:           - Patient has a contact number available for                            emergencies. The signs and symptoms of potential                            delayed complications were discussed with the                            patient. Return to normal activities tomorrow.                            Written discharge instructions were provided to the  patient.                           - Resume previous diet.                            - Continue present medications.                           - Await pathology results.                           - Repeat colonoscopy date to be determined after                            pending pathology results are reviewed for                            surveillance based on pathology results. Mauri Pole, MD 12/15/2017 9:11:39 AM This report has been signed electronically.

## 2017-12-15 NOTE — Progress Notes (Signed)
Report given to PACU, vss 

## 2017-12-15 NOTE — Progress Notes (Signed)
Pt's states no medical or surgical changes since previsit or office visit. 

## 2017-12-16 ENCOUNTER — Telehealth: Payer: Self-pay | Admitting: *Deleted

## 2017-12-16 NOTE — Telephone Encounter (Signed)
  Follow up Call-  Call back number 12/15/2017  Post procedure Call Back phone  # [phone]  Permission to leave phone message Yes  Some recent data might be hidden     Patient questions:  Do you have a fever, pain , or abdominal swelling? No. Pain Score  0 *  Have you tolerated food without any problems? Yes.    Have you been able to return to your normal activities? Yes.    Do you have any questions about your discharge instructions: Diet   No. Medications  No. Follow up visit  No.  Do you have questions or concerns about your Care? No.  Actions: * If pain score is 4 or above: No action needed, pain <4.

## 2017-12-19 ENCOUNTER — Encounter: Payer: Self-pay | Admitting: Gastroenterology

## 2018-05-08 DIAGNOSIS — S0990XA Unspecified injury of head, initial encounter: Secondary | ICD-10-CM

## 2018-05-08 HISTORY — DX: Unspecified injury of head, initial encounter: S09.90XA

## 2020-12-18 ENCOUNTER — Encounter: Payer: Self-pay | Admitting: Gastroenterology

## 2021-05-22 ENCOUNTER — Telehealth: Payer: Self-pay | Admitting: Oncology

## 2021-05-22 NOTE — Telephone Encounter (Signed)
Scheduled appt per 10/4 referral. Pt is aware of appt date and time.

## 2021-05-31 ENCOUNTER — Other Ambulatory Visit: Payer: Self-pay | Admitting: Oncology

## 2021-05-31 DIAGNOSIS — D45 Polycythemia vera: Secondary | ICD-10-CM

## 2021-05-31 NOTE — Progress Notes (Signed)
[facility]  [address] [place],  [place]  [address] [phone]  Clinic Day:  06/01/2021  Referring physician: Everardo Beals, NP   HISTORY OF PRESENT ILLNESS:  The patient is a 60 y.o. female  who I was asked to consult upon for polycythemia.  Labs in September 2022 showed an elevated hemoglobin of 18.1.  Her red cell count was also elevated at 6.06 million.  The patient claims she had never been told before of having an elevated hemoglobin level.  She is a heavy smoker.  She claims her mother had hemochromatosis and required phlebotomies to control her iron levels.  The patient has been on a diuretic for 10+ years as part of her blood pressure regimen.  She denies having undergone a previous splenectomy.  She denies being on any type of anabolic steroid, which can cause a secondary polycythemia.  Overall, she denies having any particular changes in her health over these past months.    PAST MEDICAL HISTORY:   Past Medical History:  Diagnosis Date   Arthritis    hips   Chronic hip pain    Diabetes mellitus without complication ([place])    Hyperlipidemia    Hypertension     PAST SURGICAL HISTORY:   Past Surgical History:  Procedure Laterality Date   CESAREAN SECTION  9983,3825   x2;during 1988 c section "has to be shocked 3 times to get heart going"    MULTIPLE TOOTH EXTRACTIONS  2014   under anesthesia all teeth pulled   STERIOD INJECTION     in back under anesthesia    UPPER GASTROINTESTINAL ENDOSCOPY  09/2009   in [place]-bleeding ulcers    CURRENT MEDICATIONS:   Current Outpatient Medications  Medication Sig Dispense Refill   ACCU-CHEK SOFTCLIX LANCETS lancets Accu-Chek Softclix Lancets  TEST twice a day     amitriptyline (ELAVIL) 50 MG tablet Take 50 mg by mouth at bedtime.   0   amLODipine (NORVASC) 5 MG tablet Take 1 tablet by mouth daily.  1   atorvastatin (LIPITOR) 20 MG tablet Take 20 mg by mouth daily.  0   citalopram  (CELEXA) 40 MG tablet Take 40 mg by mouth daily.   0   cyclobenzaprine (AMRIX) 15 MG 24 hr capsule Take 1 tablet by mouth at bedtime.     lisinopril-hydrochlorothiazide (PRINZIDE,ZESTORETIC) 20-25 MG per tablet Take 1 tablet by mouth daily.   0   LORazepam (ATIVAN) 1 MG tablet Take 1 mg by mouth every 8 (eight) hours as needed for anxiety.   0   metFORMIN (GLUCOPHAGE) 500 MG tablet Take 500 mg by mouth daily with breakfast.   0   metoprolol (LOPRESSOR) 50 MG tablet Take 50 mg by mouth 2 (two) times daily.   0   naloxone (NARCAN) nasal spray 4 mg/0.1 mL Narcan 4 mg/actuation nasal spray  CALL 911 ADMINISTER A SINGLE SPRAY OF NARCAN IN 1(ONE) NOSTRIL RE...  (REFER TO PRESCRIPTION NOTES).     oxyCODONE-acetaminophen (PERCOCET) 10-325 MG tablet Take 1 tablet by mouth every 4 (four) hours as needed for pain.   0   PROAIR HFA 108 (90 BASE) MCG/ACT inhaler Inhale 1-2 puffs into the lungs every 4 (four) hours as needed for wheezing or shortness of breath.   0   Vitamin D, Ergocalciferol, (DRISDOL) 50000 units CAPS capsule Take 50,000 Units by mouth once a week.  0   Current Facility-Administered Medications  Medication Dose Route Frequency Provider Last Rate Last Admin  0.9 %  sodium chloride infusion  500 mL Intravenous Once Nandigam, Venia Minks, MD        ALLERGIES:   Allergies  Allergen Reactions   Aspirin Other (See Comments)    Bleeding ulcers   Nsaids Other (See Comments)    Bleeding ulcers    FAMILY HISTORY:  Father died from cirrhosis Mother died from complications of atrial fibrillation 1 sister has COPD  SOCIAL HISTORY:  The patient was born in [place].  She lives in [place] .  She is divorced, with 2 children and 1 grandchild.  She is on disability; she previously did convenient store work for years.  She has smoked a half a pack of cigarettes daily x 45 years.  She used alcohol previously, but has not done so in 20 years.    REVIEW OF SYSTEMS:  Review of Systems   Constitutional:  Negative for fatigue and fever.  HENT:   Negative for hearing loss and sore throat.   Eyes:  Negative for eye problems.  Respiratory:  Negative for chest tightness, cough and hemoptysis.   Cardiovascular:  Negative for chest pain and palpitations.  Gastrointestinal:  Negative for abdominal distention, abdominal pain, blood in stool, constipation, diarrhea, nausea and vomiting.  Endocrine: Negative for hot flashes.  Genitourinary:  Negative for difficulty urinating, dysuria, frequency, hematuria and nocturia.   Musculoskeletal:  Positive for back pain. Negative for arthralgias, gait problem and myalgias.  Skin: Negative.  Negative for itching and rash.  Neurological: Negative.  Negative for dizziness, extremity weakness, gait problem, headaches, light-headedness and numbness.  Hematological: Negative.   Psychiatric/Behavioral:  Negative for depression and suicidal ideas. The patient is nervous/anxious.     PHYSICAL EXAM:  Blood pressure 111/74, pulse 92, temperature 98 F (36.7 C), resp. rate 16, height 5\' 4"  (1.626 m), weight 182 lb 9.6 oz (82.8 kg), SpO2 94 %. Wt Readings from Last 3 Encounters:  06/01/21 182 lb 9.6 oz (82.8 kg)  12/15/17 177 lb (80.3 kg)  12/01/17 177 lb (80.3 kg)   Body mass index is 31.34 kg/m. Performance status (ECOG): 0 - Asymptomatic Physical Exam Constitutional:      Appearance: Normal appearance. She is not ill-appearing.  HENT:     Mouth/Throat:     Mouth: Mucous membranes are moist.     Pharynx: Oropharynx is clear. No oropharyngeal exudate or posterior oropharyngeal erythema.  Cardiovascular:     Rate and Rhythm: Normal rate and regular rhythm.     Heart sounds: No murmur heard.   No friction rub. No gallop.  Pulmonary:     Effort: Pulmonary effort is normal. No respiratory distress.     Breath sounds: Normal breath sounds. No wheezing, rhonchi or rales.  Abdominal:     General: Bowel sounds are normal. There is no distension.      Palpations: Abdomen is soft. There is no mass.     Tenderness: There is no abdominal tenderness.  Musculoskeletal:        General: No swelling.     Right lower leg: No edema.     Left lower leg: No edema.  Lymphadenopathy:     Cervical: No cervical adenopathy.     Upper Body:     Right upper body: No supraclavicular or axillary adenopathy.     Left upper body: No supraclavicular or axillary adenopathy.     Lower Body: No right inguinal adenopathy. No left inguinal adenopathy.  Skin:    General: Skin is warm.  Coloration: Skin is not jaundiced.     Findings: No lesion or rash.  Neurological:     General: No focal deficit present.     Mental Status: She is alert and oriented to person, place, and time. Mental status is at baseline.     Cranial Nerves: Cranial nerves are intact.  Psychiatric:        Mood and Affect: Mood normal.        Behavior: Behavior normal.        Thought Content: Thought content normal.    LABS:   CBC Latest Ref Rng & Units 06/01/2021 10/19/2015 02/28/2010  WBC - 9.2 14.6(H) 8.0  Hemoglobin 12.0 - 16.0 17.1(A) 16.3(H) 15.6(H)  Hematocrit 36 - 46 53(A) 50.2(H) 48.6(H)  Platelets 150 - 399 180 294 193   CMP Latest Ref Rng & Units 10/19/2015 05/14/2010 02/28/2010  Glucose 65 - 99 mg/dL 180(H) 116(H) 100(H)  BUN 6 - 20 mg/dL 17 21 14   Creatinine 0.44 - 1.00 mg/dL 0.90 0.91 0.90  Sodium 135 - 145 mmol/L 146(H) 142 141  Potassium 3.5 - 5.1 mmol/L 3.7 5.0 4.3  Chloride 101 - 111 mmol/L 108 106 105  CO2 22 - 32 mmol/L 21(L) 27 26  Calcium 8.9 - 10.3 mg/dL 9.8 10.3 9.9  Total Protein 6.0 - 8.3 g/dL - 7.3 7.2  Total Bilirubin 0.3 - 1.2 mg/dL - 0.7 0.5  Alkaline Phos 39 - 117 units/L - 89 65  AST 0 - 37 units/L - 18 14  ALT 0 - 35 units/L - 28 23    ASSESSMENT & PLAN:  A 60 y.o. female who I was asked to consult upon for polycythemia.  There is a very good chance her polycythemia is secondary to her numerous years of smoking.  As there is a family history  of hemochromatosis, I will check her iron parameters and perform hemochromatosis mutation testing to ensure this disorder is not behind her polycythemia.  I will also order a JAK2 mutation test to ensure polycythemia rubra vera is not present.  Although elevated, her hemoglobin is not particularly elevated to where immediate intervention is necessary.  I will see her back in 2 weeks to go over all of her labs collected today and their implications. The patient understands all the plans discussed today and is in agreement with them.  I do appreciate Everardo Beals, NP for his new consult.   Sterling Mondo Macarthur Critchley, MD

## 2021-06-01 ENCOUNTER — Inpatient Hospital Stay: Payer: Medicaid Other | Attending: Oncology | Admitting: Oncology

## 2021-06-01 ENCOUNTER — Other Ambulatory Visit: Payer: Self-pay | Admitting: Hematology and Oncology

## 2021-06-01 ENCOUNTER — Telehealth: Payer: Self-pay | Admitting: Oncology

## 2021-06-01 ENCOUNTER — Inpatient Hospital Stay: Payer: Medicaid Other

## 2021-06-01 ENCOUNTER — Encounter: Payer: Self-pay | Admitting: Oncology

## 2021-06-01 ENCOUNTER — Other Ambulatory Visit: Payer: Self-pay | Admitting: Oncology

## 2021-06-01 DIAGNOSIS — D45 Polycythemia vera: Secondary | ICD-10-CM | POA: Diagnosis present

## 2021-06-01 DIAGNOSIS — D751 Secondary polycythemia: Secondary | ICD-10-CM | POA: Diagnosis not present

## 2021-06-01 LAB — IRON AND TIBC
Iron: 82 ug/dL (ref 28–170)
Saturation Ratios: 19 % (ref 10.4–31.8)
TIBC: 429 ug/dL (ref 250–450)
UIBC: 347 ug/dL

## 2021-06-01 LAB — CBC
MCV: 93 (ref 81–99)
RBC: 5.73 — AB (ref 3.87–5.11)

## 2021-06-01 LAB — CBC AND DIFFERENTIAL
HCT: 53 — AB (ref 36–46)
Hemoglobin: 17.1 — AB (ref 12.0–16.0)
Neutrophils Absolute: 5.89
Platelets: 180 (ref 150–399)
WBC: 9.2

## 2021-06-01 LAB — FERRITIN: Ferritin: 33 ng/mL (ref 11–307)

## 2021-06-01 NOTE — Telephone Encounter (Signed)
Per 10/14 LOS, patient scheduled for 10/28 Follow Up.  Gave patient Appt Summary

## 2021-06-01 NOTE — Progress Notes (Unsigned)
Submitted PA request for CPT 28406 (JAK 2) and uploaded clinicals to [facility] web-page; pending review  Confirm Notification/Prior Authorization  Thank you for your online Notification/Prior Authorization submission.  The notification/prior authorization case information was transmitted on 06/01/2021 at 8:28 AM CDT. The notification/prior authorization reference number is [id number].

## 2021-06-03 DIAGNOSIS — D751 Secondary polycythemia: Secondary | ICD-10-CM | POA: Insufficient documentation

## 2021-06-03 HISTORY — DX: Secondary polycythemia: D75.1

## 2021-06-11 NOTE — Progress Notes (Incomplete)
[facility]  [address] [place],  [place]  [address] [phone]  Clinic Day:  06/11/2021  Referring physician: Enid Skeens., MD  This document serves as a record of services personally performed by Dequincy Macarthur Critchley, MD. It was created on their behalf by [facility] E, a trained medical scribe. The creation of this record is based on the scribe's personal observations and the provider's statements to them.  HISTORY OF PRESENT ILLNESS:  The patient is a 60 y.o. female  who I recently began seeing for polycythemia.  Labs in September 2022 showed an elevated hemoglobin of 18.1.  Her red cell count was also elevated at 6.06 million.  The patient claims she had never been told before of having an elevated hemoglobin level.  She is a heavy smoker.  She claims her mother had hemochromatosis and required phlebotomies to control her iron levels.  The patient has been on a diuretic for 10+ years as part of her blood pressure regimen.  She denies having undergone a previous splenectomy.  She denies being on any type of anabolic steroid, which can cause a secondary polycythemia.  Overall, she denies having any particular changes in her health over these past months.    PHYSICAL EXAM:  There were no vitals taken for this visit. Wt Readings from Last 3 Encounters:  06/01/21 182 lb 9.6 oz (82.8 kg)  12/15/17 177 lb (80.3 kg)  12/01/17 177 lb (80.3 kg)   There is no height or weight on file to calculate BMI. Performance status (ECOG): 0 - Asymptomatic Physical Exam Constitutional:      Appearance: Normal appearance. She is not ill-appearing.  HENT:     Mouth/Throat:     Mouth: Mucous membranes are moist.     Pharynx: Oropharynx is clear. No oropharyngeal exudate or posterior oropharyngeal erythema.  Cardiovascular:     Rate and Rhythm: Normal rate and regular rhythm.     Heart sounds: No murmur heard.   No friction rub. No gallop.  Pulmonary:      Effort: Pulmonary effort is normal. No respiratory distress.     Breath sounds: Normal breath sounds. No wheezing, rhonchi or rales.  Abdominal:     General: Bowel sounds are normal. There is no distension.     Palpations: Abdomen is soft. There is no mass.     Tenderness: There is no abdominal tenderness.  Musculoskeletal:        General: No swelling.     Right lower leg: No edema.     Left lower leg: No edema.  Lymphadenopathy:     Cervical: No cervical adenopathy.     Upper Body:     Right upper body: No supraclavicular or axillary adenopathy.     Left upper body: No supraclavicular or axillary adenopathy.     Lower Body: No right inguinal adenopathy. No left inguinal adenopathy.  Skin:    General: Skin is warm.     Coloration: Skin is not jaundiced.     Findings: No lesion or rash.  Neurological:     General: No focal deficit present.     Mental Status: She is alert and oriented to person, place, and time. Mental status is at baseline.  Psychiatric:        Mood and Affect: Mood normal.        Behavior: Behavior normal.        Thought Content: Thought content normal.    LABS:   CBC  Latest Ref Rng & Units 06/01/2021 10/19/2015 02/28/2010  WBC - 9.2 14.6(H) 8.0  Hemoglobin 12.0 - 16.0 17.1(A) 16.3(H) 15.6(H)  Hematocrit 36 - 46 53(A) 50.2(H) 48.6(H)  Platelets 150 - 399 180 294 193   CMP Latest Ref Rng & Units 10/19/2015 05/14/2010 02/28/2010  Glucose 65 - 99 mg/dL 180(H) 116(H) 100(H)  BUN 6 - 20 mg/dL 17 21 14   Creatinine 0.44 - 1.00 mg/dL 0.90 0.91 0.90  Sodium 135 - 145 mmol/L 146(H) 142 141  Potassium 3.5 - 5.1 mmol/L 3.7 5.0 4.3  Chloride 101 - 111 mmol/L 108 106 105  CO2 22 - 32 mmol/L 21(L) 27 26  Calcium 8.9 - 10.3 mg/dL 9.8 10.3 9.9  Total Protein 6.0 - 8.3 g/dL - 7.3 7.2  Total Bilirubin 0.3 - 1.2 mg/dL - 0.7 0.5  Alkaline Phos 39 - 117 units/L - 89 65  AST 0 - 37 units/L - 18 14  ALT 0 - 35 units/L - 28 23   Iron 28 - 170 ug/dL 82   TIBC 250 - 450 ug/dL 429    Saturation Ratios 10.4 - 31.8 % 19   UIBC ug/dL 347    Ferritin 11 - 307 ng/mL 33     ASSESSMENT & PLAN:  A 60 y.o. female with polycythemia.  There is a very good chance her polycythemia is secondary to her numerous years of smoking.  As there is a family history of hemochromatosis, I will check her iron parameters and perform hemochromatosis mutation testing to ensure this disorder is not behind her polycythemia.  I will also order a JAK2 mutation test to ensure polycythemia rubra vera is not present.  Although elevated, her hemoglobin is not particularly elevated to where immediate intervention is necessary.  I will see her back in 2 weeks to go over all of her labs collected today and their implications. The patient understands all the plans discussed today and is in agreement with them.  I, Rita Ohara, am acting as scribe for Marice Potter, MD    I have reviewed this report as typed by the medical scribe, and it is complete and accurate.  Dequincy Macarthur Critchley, MD

## 2021-06-15 ENCOUNTER — Ambulatory Visit: Payer: Medicaid Other | Admitting: Oncology

## 2021-06-21 ENCOUNTER — Telehealth: Payer: Self-pay

## 2021-06-21 NOTE — Telephone Encounter (Addendum)
Melissa,NP - It did not get approved. I don't know what his plans are. I let him know. I did 3 peer to peer all for labs and none were approved.       Dr Bobby Rumpf ordered a JAK 2 mutation test on pt. Pt's insurance denied the claim. They are requiring a peer to peer conversation. Pt notified of above. She verbalized understanding.

## 2021-12-19 NOTE — Progress Notes (Incomplete)
?[facility]  ?[address] ?[place],  [place]  [address] ?[phone] ? ?Clinic Day:  12/19/2021 ? ?Referring physician: Enid Skeens., MD ? ? ?HISTORY OF PRESENT ILLNESS:  ?The patient is a 61 y.o. female  who I was asked to consult upon for polycythemia.  Labs in September 2022 showed an elevated hemoglobin of 18.1.  Her red cell count was also elevated at 6.06 million.  The patient claims she had never been told before of having an elevated hemoglobin level.  She is a heavy smoker.  She claims her mother had hemochromatosis and required phlebotomies to control her iron levels.  The patient has been on a diuretic for 10+ years as part of her blood pressure regimen.  She denies having undergone a previous splenectomy.  She denies being on any type of anabolic steroid, which can cause a secondary polycythemia.  Overall, she denies having any particular changes in her health over these past months.   ? ?PAST MEDICAL HISTORY:  ? ?Past Medical History:  ?Diagnosis Date  ?? Arthritis   ? hips  ?? Chronic hip pain   ?? Diabetes mellitus without complication ([place])   ?? Hyperlipidemia   ?? Hypertension   ? ? ?PAST SURGICAL HISTORY:  ? ?Past Surgical History:  ?Procedure Laterality Date  ?? CESAREAN SECTION  6045,4098  ? x2;during 1988 c section "has to be shocked 3 times to get heart going"   ?? MULTIPLE TOOTH EXTRACTIONS  2014  ? under anesthesia all teeth pulled  ?? STERIOD INJECTION    ? in [place] under anesthesia   ?? UPPER GASTROINTESTINAL ENDOSCOPY  09/2009  ? in [place]eding [place]  ? ? ?CURRENT MEDICATIONS:  ? ?Current Outpatient Medications  ?Medication Sig Dispense Refill  ?? ACCU-CHEK SOFTCLIX LANCETS lancets Accu-Chek Softclix Lancets ? TEST twice a day    ?? amitriptyline (ELAVIL) 50 MG tablet Take 50 mg by mouth at bedtime.   0  ?? amLODipine (NORVASC) 5 MG tablet Take 1 tablet by mouth daily.  1  ?? atorvastatin (LIPITOR) 20 MG tablet Take 20 mg by mouth daily.  0  ??  citalopram (CELEXA) 40 MG tablet Take 40 mg by mouth daily.   0  ?? cyclobenzaprine (AMRIX) 15 MG 24 hr capsule Take 1 tablet by mouth at bedtime.    ?? lisinopril-hydrochlorothiazide (PRINZIDE,ZESTORETIC) 20-25 MG per tablet Take 1 tablet by mouth daily.   0  ?? LORazepam (ATIVAN) 1 MG tablet Take 1 mg by mouth every 8 (eight) hours as needed for anxiety.   0  ?? metFORMIN (GLUCOPHAGE) 500 MG tablet Take 500 mg by mouth daily with breakfast.   0  ?? metoprolol (LOPRESSOR) 50 MG tablet Take 50 mg by mouth 2 (two) times daily.   0  ?? naloxone (NARCAN) nasal spray 4 mg/0.1 mL Narcan 4 mg/actuation nasal spray ? CALL 911 ADMINISTER A SINGLE SPRAY OF NARCAN IN 1(ONE) NOSTRIL RE...  (REFER TO PRESCRIPTION NOTES).    ?? oxyCODONE-acetaminophen (PERCOCET) 10-325 MG tablet Take 1 tablet by mouth every 4 (four) hours as needed for pain.   0  ?? PROAIR HFA 108 (90 BASE) MCG/ACT inhaler Inhale 1-2 puffs into the lungs every 4 (four) hours as needed for wheezing or shortness of breath.   0  ?? Vitamin D, Ergocalciferol, (DRISDOL) 50000 units CAPS capsule Take 50,000 Units by mouth once a week.  0  ? ?Current Facility-Administered Medications  ?Medication Dose Route Frequency Provider Last Rate Last  Admin  ?? 0.9 %  sodium chloride infusion  500 mL Intravenous Once Nandigam, Venia Minks, MD      ? ? ?ALLERGIES:  ? ?Allergies  ?Allergen Reactions  ?? Aspirin Other (See Comments)  ?  Bleeding ulcers  ?? Nsaids Other (See Comments)  ?  Bleeding ulcers  ? ? ?FAMILY HISTORY:  ?Father died from cirrhosis ?Mother died from complications of atrial fibrillation ?1 sister has COPD ? ?SOCIAL HISTORY:  ?The patient was born in [place].  She lives in [place] .  She is divorced, with 2 children and 1 grandchild.  She is on disability; she previously did convenient store work for years.  She has smoked a half a pack of cigarettes daily x 45 years.  She used alcohol previously, but has not done so in 20 years.   ? ?REVIEW OF SYSTEMS:   ?Review of Systems  ?Constitutional:  Negative for fatigue and fever.  ?HENT:   Negative for hearing loss and sore throat.   ?Eyes:  Negative for eye problems.  ?Respiratory:  Negative for chest tightness, cough and hemoptysis.   ?Cardiovascular:  Negative for chest pain and palpitations.  ?Gastrointestinal:  Negative for abdominal distention, abdominal pain, blood in stool, constipation, diarrhea, nausea and vomiting.  ?Endocrine: Negative for hot flashes.  ?Genitourinary:  Negative for difficulty urinating, dysuria, frequency, hematuria and nocturia.   ?Musculoskeletal:  Positive for back pain. Negative for arthralgias, gait problem and myalgias.  ?Skin: Negative.  Negative for itching and rash.  ?Neurological: Negative.  Negative for dizziness, extremity weakness, gait problem, headaches, light-headedness and numbness.  ?Hematological: Negative.   ?Psychiatric/Behavioral:  Negative for depression and suicidal ideas. The patient is nervous/anxious.    ? ?PHYSICAL EXAM:  ?There were no vitals taken for this visit. ?Wt Readings from Last 3 Encounters:  ?06/01/21 182 lb 9.6 oz (82.8 kg)  ?12/15/17 177 lb (80.3 kg)  ?12/01/17 177 lb (80.3 kg)  ? ?There is no height or weight on file to calculate BMI. ?Performance status (ECOG): 0 - Asymptomatic ?Physical Exam ?Constitutional:   ?   Appearance: Normal appearance. She is not ill-appearing.  ?HENT:  ?   Mouth/Throat:  ?   Mouth: Mucous membranes are moist.  ?   Pharynx: Oropharynx is clear. No oropharyngeal exudate or posterior oropharyngeal erythema.  ?Cardiovascular:  ?   Rate and Rhythm: Normal rate and regular rhythm.  ?   Heart sounds: No murmur heard. ?  No friction rub. No gallop.  ?Pulmonary:  ?   Effort: Pulmonary effort is normal. No respiratory distress.  ?   Breath sounds: Normal breath sounds. No wheezing, rhonchi or rales.  ?Abdominal:  ?   General: Bowel sounds are normal. There is no distension.  ?   Palpations: Abdomen is soft. There is no mass.  ?    Tenderness: There is no abdominal tenderness.  ?Musculoskeletal:     ?   General: No swelling.  ?   Right lower leg: No edema.  ?   Left lower leg: No edema.  ?Lymphadenopathy:  ?   Cervical: No cervical adenopathy.  ?   Upper Body:  ?   Right upper body: No supraclavicular or axillary adenopathy.  ?   Left upper body: No supraclavicular or axillary adenopathy.  ?   Lower Body: No right inguinal adenopathy. No left inguinal adenopathy.  ?Skin: ?   General: Skin is warm.  ?   Coloration: Skin is not jaundiced.  ?   Findings: No  lesion or rash.  ?Neurological:  ?   General: No focal deficit present.  ?   Mental Status: She is alert and oriented to person, place, and time. Mental status is at baseline.  ?Psychiatric:     ?   Mood and Affect: Mood normal.     ?   Behavior: Behavior normal.     ?   Thought Content: Thought content normal.  ? ? ?LABS:  ? ? ?  Latest Ref Rng & Units 06/01/2021  ? 12:00 AM 10/19/2015  ?  6:10 PM 02/28/2010  ?  9:46 PM  ?CBC  ?WBC  9.2      14.6   8.0    ?Hemoglobin 12.0 - 16.0 17.1      16.3   15.6    ?Hematocrit 36 - 46 53      50.2   48.6    ?Platelets 150 - 399 180      294   193    ?  ? This result is from an external source.  ? ? ? ?  Latest Ref Rng & Units 10/19/2015  ?  6:10 PM 05/14/2010  ?  8:31 PM 02/28/2010  ?  9:46 PM  ?CMP  ?Glucose 65 - 99 mg/dL 180   116   100    ?BUN 6 - 20 mg/dL '17   21   14    '$ ?Creatinine 0.44 - 1.00 mg/dL 0.90   0.91   0.90    ?Sodium 135 - 145 mmol/L 146   142   141    ?Potassium 3.5 - 5.1 mmol/L 3.7   5.0   4.3    ?Chloride 101 - 111 mmol/L 108   106   105    ?CO2 22 - 32 mmol/L '21   27   26    '$ ?Calcium 8.9 - 10.3 mg/dL 9.8   10.3   9.9    ?Total Protein 6.0 - 8.3 g/dL  7.3   7.2    ?Total Bilirubin 0.3 - 1.2 mg/dL  0.7   0.5    ?Alkaline Phos 39 - 117 units/L  89   65    ?AST 0 - 37 units/L  18   14    ?ALT 0 - 35 units/L  28   23    ? ? Latest Reference Range & Units 06/01/21 10:20  ?Iron 28 - 170 ug/dL 82  ?UIBC ug/dL 347  ?TIBC 250 - 450 ug/dL 429   ?Saturation Ratios 10.4 - 31.8 % 19  ?Ferritin 11 - 307 ng/mL 33  ? ?ASSESSMENT & PLAN:  ?A 61 y.o. female who I was asked to consult upon for polycythemia.  There is a very good chance her polycythemia is secondary to

## 2021-12-20 ENCOUNTER — Other Ambulatory Visit: Payer: Self-pay

## 2021-12-20 ENCOUNTER — Inpatient Hospital Stay: Payer: Medicaid Other | Admitting: Oncology

## 2021-12-20 ENCOUNTER — Inpatient Hospital Stay: Payer: Medicaid Other

## 2022-08-30 ENCOUNTER — Other Ambulatory Visit: Payer: Self-pay

## 2022-08-30 DIAGNOSIS — E119 Type 2 diabetes mellitus without complications: Secondary | ICD-10-CM | POA: Insufficient documentation

## 2022-08-30 DIAGNOSIS — L853 Xerosis cutis: Secondary | ICD-10-CM | POA: Insufficient documentation

## 2022-08-30 DIAGNOSIS — I10 Essential (primary) hypertension: Secondary | ICD-10-CM | POA: Insufficient documentation

## 2022-08-30 DIAGNOSIS — F411 Generalized anxiety disorder: Secondary | ICD-10-CM | POA: Insufficient documentation

## 2022-08-30 DIAGNOSIS — M1990 Unspecified osteoarthritis, unspecified site: Secondary | ICD-10-CM | POA: Insufficient documentation

## 2022-08-30 DIAGNOSIS — R9431 Abnormal electrocardiogram [ECG] [EKG]: Secondary | ICD-10-CM | POA: Insufficient documentation

## 2022-08-30 DIAGNOSIS — E785 Hyperlipidemia, unspecified: Secondary | ICD-10-CM | POA: Insufficient documentation

## 2022-08-30 DIAGNOSIS — F419 Anxiety disorder, unspecified: Secondary | ICD-10-CM | POA: Insufficient documentation

## 2022-08-30 DIAGNOSIS — G8929 Other chronic pain: Secondary | ICD-10-CM | POA: Insufficient documentation

## 2022-09-18 ENCOUNTER — Ambulatory Visit: Payer: Medicaid Other | Attending: Cardiology | Admitting: Cardiology

## 2022-09-18 ENCOUNTER — Encounter: Payer: Self-pay | Admitting: Cardiology

## 2022-09-18 ENCOUNTER — Encounter: Payer: Self-pay | Admitting: Gastroenterology

## 2022-09-18 VITALS — BP 111/71 | HR 91 | Ht 64.0 in | Wt 159.6 lb

## 2022-09-18 DIAGNOSIS — E782 Mixed hyperlipidemia: Secondary | ICD-10-CM | POA: Diagnosis not present

## 2022-09-18 DIAGNOSIS — F17210 Nicotine dependence, cigarettes, uncomplicated: Secondary | ICD-10-CM

## 2022-09-18 DIAGNOSIS — R0989 Other specified symptoms and signs involving the circulatory and respiratory systems: Secondary | ICD-10-CM

## 2022-09-18 DIAGNOSIS — E119 Type 2 diabetes mellitus without complications: Secondary | ICD-10-CM

## 2022-09-18 DIAGNOSIS — R011 Cardiac murmur, unspecified: Secondary | ICD-10-CM

## 2022-09-18 DIAGNOSIS — I10 Essential (primary) hypertension: Secondary | ICD-10-CM | POA: Diagnosis not present

## 2022-09-18 DIAGNOSIS — R0609 Other forms of dyspnea: Secondary | ICD-10-CM

## 2022-09-18 DIAGNOSIS — I2510 Atherosclerotic heart disease of native coronary artery without angina pectoris: Secondary | ICD-10-CM | POA: Diagnosis not present

## 2022-09-18 NOTE — Patient Instructions (Signed)
Medication Instructions:  Your physician has recommended you make the following change in your medication:  Start taking 81 mg Aspirin once daily  *If you need a refill on your cardiac medications before your next appointment, please call your pharmacy*   Lab Work: NONE If you have labs (blood work) drawn today and your tests are completely normal, you will receive your results only by: [place] (if you have MyChart) OR A paper copy in the mail If you have any lab test that is abnormal or we need to change your treatment, we will call you to review the results.   Testing/Procedures: Your physician has requested that you have an echocardiogram. Echocardiography is a painless test that uses sound waves to create images of your heart. It provides your doctor with information about the size and shape of your heart and how well your heart's chambers and valves are working. This procedure takes approximately one hour. There are no restrictions for this procedure. Please do NOT wear cologne, perfume, aftershave, or lotions (deodorant is allowed). Please arrive 15 minutes prior to your appointment time.   Your physician has requested that you have an abdominal aorta duplex. During this test, an ultrasound is used to evaluate the aorta. Allow 30 minutes for this exam. Do not eat after midnight the day before and avoid carbonated beverages   Your physician has requested that you have a lexiscan myoview. For further information please visit [email or web address]. Please follow instruction sheet, as given.   The test will take approximately 3 to 4 hours to complete; you may bring reading material.  If someone comes with you to your appointment, they will need to remain in the main lobby due to limited space in the testing area. **If you are pregnant or breastfeeding, please notify the nuclear lab prior to your appointment**  How to prepare for your Myocardial Perfusion Test: Do not eat or drink  3 hours prior to your test, except you may have water. Do not consume products containing caffeine (regular or decaffeinated) 12 hours prior to your test. (ex: coffee, chocolate, sodas, tea). Do bring a list of your current medications with you.  If not listed below, you may take your medications as normal. Do wear comfortable clothes (no dresses or overalls) and walking shoes, tennis shoes preferred (No heels or open toe shoes are allowed). Do NOT wear cologne, perfume, aftershave, or lotions (deodorant is allowed). If these instructions are not followed, your test will have to be rescheduled.    Follow-Up: At [facility], you and your health needs are our priority.  As part of our continuing mission to provide you with exceptional heart care, we have created designated Provider Care Teams.  These Care Teams include your primary Cardiologist (physician) and Advanced Practice Providers (APPs -  Physician Assistants and Nurse Practitioners) who all work together to provide you with the care you need, when you need it.  We recommend signing up for the patient portal called "MyChart".  Sign up information is provided on this After Visit Summary.  MyChart is used to connect with patients for Virtual Visits (Telemedicine).  Patients are able to view lab/test results, encounter notes, upcoming appointments, etc.  Non-urgent messages can be sent to your provider as well.   To learn more about what you can do with MyChart, go to [email or web address].    Your next appointment:   9 month(s)  Provider:   Jyl Heinz, MD    Other Instructions

## 2022-09-18 NOTE — Progress Notes (Signed)
Cardiology Office Note:    Date:  09/18/2022   ID:  Victoria Rose, DOB 1961-06-28, MRN [id number]  PCP:  Elisha Ponder, NP  Cardiologist:  Jenean Lindau, MD   Referring MD: Elisha Ponder, NP    ASSESSMENT:    1. Coronary artery calcification seen on CT scan   2. Essential (primary) hypertension   3. Diabetes mellitus without complication ([place])   4. Mixed hyperlipidemia   5. Cigarette smoker   6. Dyspnea on exertion   7. Cardiac murmur   8. Prominent abdominal aortic pulsation    PLAN:    In order of problems listed above:  Coronary artery calcification seen on CT scan and dyspnea on exertion: Patient has existing coronary artery disease and has dyspnea on exertion so we will do a Lexiscan sestamibi to rule out any ischemic substrate causing the symptoms. Essential hypertension: Blood pressure is stable and diet was emphasized. Diabetes mellitus and mixed dyslipidemia: Managed by primary care.  Patient on statin therapy.  Lipids reviewed. Cigarette smoker: I spent 5 minutes with the patient discussing solely about smoking. Smoking cessation was counseled. I suggested to the patient also different medications and pharmacological interventions. Patient is keen to try stopping on its own at this time. He will get back to me if he needs any further assistance in this matter. Cardiac murmur: Echocardiogram will be done to assess murmur heard on auscultation. Prominent abdominal pulsations: In view of history of smoking will do ultrasound to rule out abdominal aortic aneurysm. She was advised to take a coated baby aspirin on a daily basis.  Benefits risks explained. Patient will be seen in follow-up appointment in 9 months or earlier if the patient has any concerns    Medication Adjustments/Labs and Tests Ordered: Current medicines are reviewed at length with the patient today.  Concerns regarding medicines are outlined above.  Orders Placed This Encounter   Procedures   MYOCARDIAL PERFUSION IMAGING   EKG 12-Lead   ECHOCARDIOGRAM COMPLETE   VAS [place] AAA DUPLEX   No orders of the defined types were placed in this encounter.    History of Present Illness:    Victoria Rose is a 62 y.o. female who is being seen today for the evaluation of coronary artery calcification seen on CT scan at the request of Willimson, Faylene Million, NP.  Patient is a pleasant 62 year old female.  She tells me that she is on disability.  She leads a sedentary lifestyle.  She has history of essential hypertension dyslipidemia diabetes mellitus and unfortunately continues to smoke heavily.  She denies any chest pain orthopnea or PND.  At the time of my evaluation, the patient is alert awake oriented and in no distress.  She gives history of dyspnea on exertion Past Medical History:  Diagnosis Date   Abnormal EKG    Anxiety 07/24/2015   Anxiety disorder    Arthritis    hips   Benign hypertension 12/01/2017   Bursitis of hip 12/01/2017   Chronic hip pain    Closed head injury 05/08/2018   Diabetes mellitus ([place]) 12/01/2017   Diabetes mellitus without complication ([place])    Essential (primary) hypertension    Hyperlipidemia    Hypertension    Hypertriglyceridemia 12/01/2017   Polycythemia, secondary 06/03/2021   Spinal stenosis 12/01/2017   Type 2 diabetes mellitus without complications ([place])    Vitamin D deficiency    Xerosis cutis     Past Surgical History:  Procedure Laterality Date  CESAREAN SECTION  9518,8416   x2;during 1988 c section "has to be shocked 3 times to get heart going"    MULTIPLE TOOTH EXTRACTIONS  2014   under anesthesia all teeth pulled   STERIOD INJECTION     in back under anesthesia    UPPER GASTROINTESTINAL ENDOSCOPY  09/2009   in [place]-bleeding ulcers    Current Medications: Current Meds  Medication Sig   amitriptyline (ELAVIL) 50 MG tablet Take 50 mg by mouth at bedtime.    amLODipine (NORVASC) 5 MG tablet Take 5 mg by mouth  daily.   aspirin EC 81 MG tablet Take 81 mg by mouth daily. Swallow whole.   atorvastatin (LIPITOR) 20 MG tablet Take 20 mg by mouth daily.   Cholecalciferol (VITAMIN D3) 50 MCG (2000 UT) capsule Take 2,000 Units by mouth daily.   cyanocobalamin (VITAMIN B12) 1000 MCG/ML injection Inject 1,000 mcg into the muscle every 30 (thirty) days.   cyclobenzaprine (FLEXERIL) 10 MG tablet Take 10 mg by mouth every 6 (six) hours.   glipiZIDE (GLUCOTROL) 5 MG tablet Take 5 mg by mouth daily.   ipratropium-albuterol (DUONEB) 0.5-2.5 (3) MG/3ML SOLN Take 3 mLs by nebulization every 6 (six) hours as needed (wheezing or shortness of breath).   lisinopril-hydrochlorothiazide (ZESTORETIC) 10-12.5 MG tablet Take 1 tablet by mouth daily.   LORazepam (ATIVAN) 0.5 MG tablet Take 1 mg by mouth 2 (two) times daily.   metFORMIN (GLUCOPHAGE) 500 MG tablet Take 500 mg by mouth daily with breakfast. And takes 1000 mg in the afternoon   naloxone (NARCAN) nasal spray 4 mg/0.1 mL Place 1 spray into the nose once.   oxyCODONE (OXY IR/ROXICODONE) 5 MG immediate release tablet Take 5 mg by mouth 4 (four) times daily as needed for moderate pain or severe pain.   PROAIR HFA 108 (90 BASE) MCG/ACT inhaler Inhale 1-2 puffs into the lungs every 4 (four) hours as needed for wheezing or shortness of breath.    Current Facility-Administered Medications for the 09/18/22 encounter (Office Visit) with Sherene Plancarte, Reita Cliche, MD  Medication   0.9 %  sodium chloride infusion     Allergies:   Metoprolol, Aspirin, and Nsaids   Social History   Socioeconomic History   Marital status: Single    Spouse name: Not on file   Number of children: Not on file   Years of education: Not on file   Highest education level: Not on file  Occupational History   Not on file  Tobacco Use   Smoking status: Every Day    Packs/day: 0.50    Types: Cigarettes   Smokeless tobacco: Never  Vaping Use   Vaping Use: Never used  Substance and Sexual Activity    Alcohol use: Not Currently    Alcohol/week: 0.0 standard drinks of alcohol    Comment: occasionally   Drug use: No   Sexual activity: Not on file  Other Topics Concern   Not on file  Social History Narrative   Not on file   Social Determinants of Health   Financial Resource Strain: Not on file  Food Insecurity: Not on file  Transportation Needs: Not on file  Physical Activity: Not on file  Stress: Not on file  Social Connections: Not on file     Family History: The patient's family history includes Asthma in her mother; Diabetes in her mother; Heart disease in her mother. There is no history of Colon cancer, Esophageal cancer, or Stomach cancer.  ROS:   Please see  the history of present illness.    All other systems reviewed and are negative.  EKGs/Labs/Other Studies Reviewed:    The following studies were reviewed today: EKG reveals sinus rhythm poor septal forces and nonspecific ST-T changes   Recent Labs: No results found for requested labs within last 365 days.  Recent Lipid Panel    Component Value Date/Time   CHOL 164 05/14/2010 2031   TRIG 106 05/14/2010 2031   HDL 42 05/14/2010 2031   CHOLHDL 3.9 Ratio 05/14/2010 2031   VLDL 21 05/14/2010 2031   LDLCALC 101 (H) 05/14/2010 2031    Physical Exam:    VS:  BP 111/71   Pulse 91   Ht '5\' 4"'$  (1.626 m)   Wt 159 lb 9.6 oz (72.4 kg)   SpO2 95%   BMI 27.40 kg/m     Wt Readings from Last 3 Encounters:  09/18/22 159 lb 9.6 oz (72.4 kg)  06/01/21 182 lb 9.6 oz (82.8 kg)  12/15/17 177 lb (80.3 kg)     GEN: Patient is in no acute distress HEENT: Normal NECK: No JVD; No carotid bruits LYMPHATICS: No lymphadenopathy CARDIAC: S1 S2 regular, 2/6 systolic murmur at the apex. RESPIRATORY:  Clear to auscultation without rales, wheezing or rhonchi  ABDOMEN: Soft, non-tender, non-distended.  Prominent abdominal pulsations MUSCULOSKELETAL:  No edema; No deformity  SKIN: Warm and dry NEUROLOGIC:  Alert and oriented  x 3 PSYCHIATRIC:  Normal affect    Signed, Jenean Lindau, MD  09/18/2022 2:33 PM    [place]

## 2022-09-20 ENCOUNTER — Other Ambulatory Visit: Payer: Self-pay

## 2022-09-27 ENCOUNTER — Ambulatory Visit: Payer: Medicaid Other | Attending: Cardiology

## 2022-09-27 DIAGNOSIS — I2510 Atherosclerotic heart disease of native coronary artery without angina pectoris: Secondary | ICD-10-CM | POA: Diagnosis not present

## 2022-09-27 DIAGNOSIS — R0609 Other forms of dyspnea: Secondary | ICD-10-CM

## 2022-09-27 DIAGNOSIS — F17210 Nicotine dependence, cigarettes, uncomplicated: Secondary | ICD-10-CM

## 2022-09-27 DIAGNOSIS — I10 Essential (primary) hypertension: Secondary | ICD-10-CM | POA: Diagnosis not present

## 2022-09-27 DIAGNOSIS — R0989 Other specified symptoms and signs involving the circulatory and respiratory systems: Secondary | ICD-10-CM

## 2022-09-27 DIAGNOSIS — E782 Mixed hyperlipidemia: Secondary | ICD-10-CM

## 2022-09-27 DIAGNOSIS — E119 Type 2 diabetes mellitus without complications: Secondary | ICD-10-CM | POA: Diagnosis not present

## 2022-09-27 DIAGNOSIS — R011 Cardiac murmur, unspecified: Secondary | ICD-10-CM

## 2022-09-27 LAB — ECHOCARDIOGRAM COMPLETE
AR max vel: 1.49 cm2
AV Area VTI: 1.61 cm2
AV Area mean vel: 1.6 cm2
AV Mean grad: 8 mmHg
AV Peak grad: 17.5 mmHg
Ao pk vel: 2.09 m/s
Area-P 1/2: 4.15 cm2
S' Lateral: 2.7 cm

## 2022-09-27 NOTE — Progress Notes (Signed)
Patient ID: Victoria Rose, female   DOB: Jul 31, 1961, 62 y.o.   MRN: [id number] Examination chaperoned by Philipp Deputy, [address]

## 2022-09-27 NOTE — Progress Notes (Signed)
Patient ID: Victoria Rose, female   DOB: 13-Jan-1961, 62 y.o.   MRN: [id number] .cha

## 2022-10-01 ENCOUNTER — Ambulatory Visit (AMBULATORY_SURGERY_CENTER): Payer: Medicaid Other

## 2022-10-01 ENCOUNTER — Telehealth (HOSPITAL_COMMUNITY): Payer: Self-pay | Admitting: *Deleted

## 2022-10-01 VITALS — Ht 64.0 in | Wt 159.0 lb

## 2022-10-01 DIAGNOSIS — Z86010 Personal history of colonic polyps: Secondary | ICD-10-CM

## 2022-10-01 MED ORDER — NA SULFATE-K SULFATE-MG SULF 17.5-3.13-1.6 GM/177ML PO SOLN: 1.0000 | Freq: Once | ORAL | 0 refills | Status: AC

## 2022-10-01 NOTE — Progress Notes (Signed)

## 2022-10-01 NOTE — Telephone Encounter (Signed)
Left message on voicemail per DPR in reference to upcoming appointment scheduled on  10/08/22 with detailed instructions given per Myocardial Perfusion Study Information Sheet for the test. LM to arrive 15 minutes early, and that it is imperative to arrive on time for appointment to keep from having the test rescheduled. If you need to cancel or reschedule your appointment, please call the office within 24 hours of your appointment. Failure to do so may result in a cancellation of your appointment, and a $50 no show fee. Phone number given for call back for any questions. Kirstie Peri, RN

## 2022-10-08 ENCOUNTER — Ambulatory Visit (INDEPENDENT_AMBULATORY_CARE_PROVIDER_SITE_OTHER): Payer: Medicaid Other

## 2022-10-08 ENCOUNTER — Ambulatory Visit: Payer: Medicaid Other | Attending: Cardiology

## 2022-10-08 DIAGNOSIS — F17210 Nicotine dependence, cigarettes, uncomplicated: Secondary | ICD-10-CM

## 2022-10-08 DIAGNOSIS — I10 Essential (primary) hypertension: Secondary | ICD-10-CM

## 2022-10-08 DIAGNOSIS — E119 Type 2 diabetes mellitus without complications: Secondary | ICD-10-CM

## 2022-10-08 DIAGNOSIS — E782 Mixed hyperlipidemia: Secondary | ICD-10-CM

## 2022-10-08 DIAGNOSIS — R0989 Other specified symptoms and signs involving the circulatory and respiratory systems: Secondary | ICD-10-CM | POA: Diagnosis not present

## 2022-10-08 DIAGNOSIS — R0609 Other forms of dyspnea: Secondary | ICD-10-CM

## 2022-10-08 DIAGNOSIS — R011 Cardiac murmur, unspecified: Secondary | ICD-10-CM

## 2022-10-08 DIAGNOSIS — I2510 Atherosclerotic heart disease of native coronary artery without angina pectoris: Secondary | ICD-10-CM | POA: Diagnosis not present

## 2022-10-08 LAB — MYOCARDIAL PERFUSION IMAGING
LV dias vol: 55 mL (ref 46–106)
LV sys vol: 16 mL
Nuc Stress EF: 71 %
Peak HR: 104 {beats}/min
Rest HR: 90 {beats}/min
Rest Nuclear Isotope Dose: 10.7 mCi
SDS: 3
SRS: 1
SSS: 4
Stress Nuclear Isotope Dose: 30.7 mCi
TID: 0.95

## 2022-10-08 MED ORDER — TECHNETIUM TC 99M TETROFOSMIN IV KIT
30.7000 | PACK | Freq: Once | INTRAVENOUS | Status: AC | PRN
  Administered 2022-10-08: 30.7 via INTRAVENOUS

## 2022-10-08 MED ORDER — TECHNETIUM TC 99M TETROFOSMIN IV KIT
10.7000 | PACK | Freq: Once | INTRAVENOUS | Status: AC | PRN
  Administered 2022-10-08: 10.7 via INTRAVENOUS

## 2022-10-08 MED ORDER — REGADENOSON 0.4 MG/5ML IV SOLN
0.4000 mg | Freq: Once | INTRAVENOUS | Status: AC
  Administered 2022-10-08: 0.4 mg via INTRAVENOUS

## 2022-10-08 NOTE — Progress Notes (Signed)
Exam observed by Enrigue Catena

## 2022-10-29 ENCOUNTER — Encounter: Payer: Medicaid Other | Admitting: Gastroenterology

## 2022-11-27 ENCOUNTER — Other Ambulatory Visit: Payer: Self-pay | Admitting: Family Medicine

## 2022-11-27 DIAGNOSIS — Z1231 Encounter for screening mammogram for malignant neoplasm of breast: Secondary | ICD-10-CM

## 2022-12-04 ENCOUNTER — Ambulatory Visit
Admission: RE | Admit: 2022-12-04 | Discharge: 2022-12-04 | Disposition: A | Discharge status: Home / Self Care | Payer: Medicaid Other | Source: Ambulatory Visit

## 2022-12-04 DIAGNOSIS — Z1231 Encounter for screening mammogram for malignant neoplasm of breast: Secondary | ICD-10-CM

## 2022-12-06 ENCOUNTER — Encounter: Payer: Medicaid Other | Admitting: Gastroenterology

## 2022-12-23 NOTE — Progress Notes (Deleted)
[facility]  [address] [place],  [place]  [address] [phone]  Clinic Day:  12/23/2022  Referring physician: Nonnie Done., MD   HISTORY OF PRESENT ILLNESS:  The patient is a 62 y.o. female  who I was asked to consult upon for polycythemia.  Labs in September 2022 showed an elevated hemoglobin of 18.1.  Her red cell count was also elevated at 6.06 million.  The patient claims she had never been told before of having an elevated hemoglobin level.  She is a heavy smoker.  She claims her mother had hemochromatosis and required phlebotomies to control her iron levels.  The patient has been on a diuretic for 10+ years as part of her blood pressure regimen.  She denies having undergone a previous splenectomy.  She denies being on any type of anabolic steroid, which can cause a secondary polycythemia.  Overall, she denies having any particular changes in her health over these past months.    PAST MEDICAL HISTORY:   Past Medical History:  Diagnosis Date   Abnormal EKG    Anxiety 07/24/2015   Anxiety disorder    Arthritis    hips   Asthma    Benign hypertension 12/01/2017   Bursitis of hip 12/01/2017   Chronic hip pain    Closed head injury 05/08/2018   Diabetes mellitus ([facility]) 12/01/2017   Diabetes mellitus without complication ([facility])    Essential (primary) hypertension    Hyperlipidemia    Hypertension    Hypertriglyceridemia 12/01/2017   Polycythemia, secondary 06/03/2021   Spinal stenosis 12/01/2017   Type 2 diabetes mellitus without complications ([facility])    Vitamin D deficiency    Xerosis cutis     PAST SURGICAL HISTORY:   Past Surgical History:  Procedure Laterality Date   CESAREAN SECTION  6962,9528   x2;during 1988 c section "has to be shocked 3 times to get heart going"    MULTIPLE TOOTH EXTRACTIONS  2014   under anesthesia all teeth pulled   STERIOD INJECTION     in back under anesthesia    UPPER GASTROINTESTINAL ENDOSCOPY   09/2009   in [place]-bleeding ulcers    CURRENT MEDICATIONS:   Current Outpatient Medications  Medication Sig Dispense Refill   amitriptyline (ELAVIL) 50 MG tablet Take 50 mg by mouth at bedtime.   0   amLODipine (NORVASC) 5 MG tablet Take 5 mg by mouth daily.     aspirin EC 81 MG tablet Take 81 mg by mouth daily. Swallow whole.     atorvastatin (LIPITOR) 20 MG tablet Take 20 mg by mouth daily.  0   Cholecalciferol (VITAMIN D3) 50 MCG (2000 UT) capsule Take 2,000 Units by mouth daily.     cyanocobalamin (VITAMIN B12) 1000 MCG/ML injection Inject 1,000 mcg into the muscle every 30 (thirty) days.     cyclobenzaprine (FLEXERIL) 10 MG tablet Take 10 mg by mouth every 6 (six) hours.     glipiZIDE (GLUCOTROL) 5 MG tablet Take 5 mg by mouth daily.     ipratropium-albuterol (DUONEB) 0.5-2.5 (3) MG/3ML SOLN Take 3 mLs by nebulization every 6 (six) hours as needed (wheezing or shortness of breath).     lisinopril-hydrochlorothiazide (ZESTORETIC) 10-12.5 MG tablet Take 1 tablet by mouth daily.     LORazepam (ATIVAN) 0.5 MG tablet Take 1 mg by mouth 2 (two) times daily.     metFORMIN (GLUCOPHAGE) 500 MG tablet Take 500 mg by mouth daily with breakfast. And takes 1000 mg  in the afternoon  0   naloxone (NARCAN) nasal spray 4 mg/0.1 mL Place 1 spray into the nose once.     oxyCODONE (OXY IR/ROXICODONE) 5 MG immediate release tablet Take 5 mg by mouth 4 (four) times daily as needed for moderate pain or severe pain.     PROAIR HFA 108 (90 BASE) MCG/ACT inhaler Inhale 1-2 puffs into the lungs every 4 (four) hours as needed for wheezing or shortness of breath.   0   Current Facility-Administered Medications  Medication Dose Route Frequency Provider Last Rate Last Admin   0.9 %  sodium chloride infusion  500 mL Intravenous Once Nandigam, Eleonore Chiquito, MD        ALLERGIES:   Allergies  Allergen Reactions   Metoprolol Anaphylaxis   Aspirin Other (See Comments)    Bleeding ulcers   Nsaids Other (See Comments)     Bleeding ulcers    FAMILY HISTORY:  Father died from cirrhosis Mother died from complications of atrial fibrillation 1 sister has COPD  SOCIAL HISTORY:  The patient was born in [place].  She lives in [place] .  She is divorced, with 2 children and 1 grandchild.  She is on disability; she previously did convenient store work for years.  She has smoked a half a pack of cigarettes daily x 45 years.  She used alcohol previously, but has not done so in 20 years.    REVIEW OF SYSTEMS:  Review of Systems  Constitutional:  Negative for fatigue and fever.  HENT:   Negative for hearing loss and sore throat.   Eyes:  Negative for eye problems.  Respiratory:  Negative for chest tightness, cough and hemoptysis.   Cardiovascular:  Negative for chest pain and palpitations.  Gastrointestinal:  Negative for abdominal distention, abdominal pain, blood in stool, constipation, diarrhea, nausea and vomiting.  Endocrine: Negative for hot flashes.  Genitourinary:  Negative for difficulty urinating, dysuria, frequency, hematuria and nocturia.   Musculoskeletal:  Positive for back pain. Negative for arthralgias, gait problem and myalgias.  Skin: Negative.  Negative for itching and rash.  Neurological: Negative.  Negative for dizziness, extremity weakness, gait problem, headaches, light-headedness and numbness.  Hematological: Negative.   Psychiatric/Behavioral:  Negative for depression and suicidal ideas. The patient is nervous/anxious.      PHYSICAL EXAM:  There were no vitals taken for this visit. Wt Readings from Last 3 Encounters:  10/08/22 159 lb (72.1 kg)  10/01/22 159 lb (72.1 kg)  09/18/22 159 lb 9.6 oz (72.4 kg)   There is no height or weight on file to calculate BMI. Performance status (ECOG): 0 - Asymptomatic Physical Exam Constitutional:      Appearance: Normal appearance. She is not ill-appearing.  HENT:     Mouth/Throat:     Mouth: Mucous membranes are moist.     Pharynx:  Oropharynx is clear. No oropharyngeal exudate or posterior oropharyngeal erythema.  Cardiovascular:     Rate and Rhythm: Normal rate and regular rhythm.     Heart sounds: No murmur heard.    No friction rub. No gallop.  Pulmonary:     Effort: Pulmonary effort is normal. No respiratory distress.     Breath sounds: Normal breath sounds. No wheezing, rhonchi or rales.  Abdominal:     General: Bowel sounds are normal. There is no distension.     Palpations: Abdomen is soft. There is no mass.     Tenderness: There is no abdominal tenderness.  Musculoskeletal:  General: No swelling.     Right lower leg: No edema.     Left lower leg: No edema.  Lymphadenopathy:     Cervical: No cervical adenopathy.     Upper Body:     Right upper body: No supraclavicular or axillary adenopathy.     Left upper body: No supraclavicular or axillary adenopathy.     Lower Body: No right inguinal adenopathy. No left inguinal adenopathy.  Skin:    General: Skin is warm.     Coloration: Skin is not jaundiced.     Findings: No lesion or rash.  Neurological:     General: No focal deficit present.     Mental Status: She is alert and oriented to person, place, and time. Mental status is at baseline.  Psychiatric:        Mood and Affect: Mood normal.        Behavior: Behavior normal.        Thought Content: Thought content normal.     LABS:      Latest Ref Rng & Units 06/01/2021   12:00 AM 10/19/2015    6:10 PM 02/28/2010    9:46 PM  CBC  WBC  9.2     14.6  8.0   Hemoglobin 12.0 - 16.0 17.1     16.3  15.6   Hematocrit 36 - 46 53     50.2  48.6   Platelets 150 - 399 180     294  193      This result is from an external source.       Latest Ref Rng & Units 10/19/2015    6:10 PM 05/14/2010    8:31 PM 02/28/2010    9:46 PM  CMP  Glucose 65 - 99 mg/dL 161  096  045   BUN 6 - 20 mg/dL 17  21  14    Creatinine 0.44 - 1.00 mg/dL 4.09  8.11  9.14   Sodium 135 - 145 mmol/L 146  142  141   Potassium 3.5  - 5.1 mmol/L 3.7  5.0  4.3   Chloride 101 - 111 mmol/L 108  106  105   CO2 22 - 32 mmol/L 21  27  26    Calcium 8.9 - 10.3 mg/dL 9.8  78.2  9.9   Total Protein 6.0 - 8.3 g/dL  7.3  7.2   Total Bilirubin 0.3 - 1.2 mg/dL  0.7  0.5   Alkaline Phos 39 - 117 units/L  89  65   AST 0 - 37 units/L  18  14   ALT 0 - 35 units/L  28  23     ASSESSMENT & PLAN:  A 62 y.o. female who I was asked to consult upon for polycythemia.  There is a very good chance her polycythemia is secondary to her numerous years of smoking.  As there is a family history of hemochromatosis, I will check her iron parameters and perform hemochromatosis mutation testing to ensure this disorder is not behind her polycythemia.  I will also order a JAK2 mutation test to ensure polycythemia rubra vera is not present.  Although elevated, her hemoglobin is not particularly elevated to where immediate intervention is necessary.  I will see her back in 2 weeks to go over all of her labs collected today and their implications. The patient understands all the plans discussed today and is in agreement with them.  I do appreciate [place], Excell Seltzer., MD for his new consult.  Nalaya Wojdyla Macarthur Critchley, MD

## 2022-12-24 ENCOUNTER — Inpatient Hospital Stay: Payer: Medicaid Other | Attending: Oncology

## 2022-12-24 ENCOUNTER — Ambulatory Visit: Payer: Medicaid Other | Admitting: Oncology

## 2022-12-24 ENCOUNTER — Telehealth: Payer: Self-pay | Admitting: Oncology

## 2022-12-24 NOTE — Telephone Encounter (Signed)
12/24/22 Patient missed her Appt's today - Called both numbers in patient's chart but was unsuccessful in reaching patient

## 2023-08-04 ENCOUNTER — Encounter (HOSPITAL_BASED_OUTPATIENT_CLINIC_OR_DEPARTMENT_OTHER): Payer: Self-pay | Admitting: Student

## 2023-08-04 ENCOUNTER — Ambulatory Visit (INDEPENDENT_AMBULATORY_CARE_PROVIDER_SITE_OTHER): Payer: Medicaid Other | Admitting: Student

## 2023-08-04 VITALS — BP 131/79 | HR 89 | Temp 98.2°F | Ht 64.0 in | Wt 135.1 lb

## 2023-08-04 DIAGNOSIS — Z7689 Persons encountering health services in other specified circumstances: Secondary | ICD-10-CM

## 2023-08-04 DIAGNOSIS — E119 Type 2 diabetes mellitus without complications: Secondary | ICD-10-CM | POA: Diagnosis not present

## 2023-08-04 DIAGNOSIS — F419 Anxiety disorder, unspecified: Secondary | ICD-10-CM | POA: Diagnosis not present

## 2023-08-04 DIAGNOSIS — J449 Chronic obstructive pulmonary disease, unspecified: Secondary | ICD-10-CM | POA: Insufficient documentation

## 2023-08-04 DIAGNOSIS — R112 Nausea with vomiting, unspecified: Secondary | ICD-10-CM | POA: Insufficient documentation

## 2023-08-04 DIAGNOSIS — I10 Essential (primary) hypertension: Secondary | ICD-10-CM

## 2023-08-04 DIAGNOSIS — E785 Hyperlipidemia, unspecified: Secondary | ICD-10-CM | POA: Diagnosis not present

## 2023-08-04 DIAGNOSIS — Z1211 Encounter for screening for malignant neoplasm of colon: Secondary | ICD-10-CM | POA: Insufficient documentation

## 2023-08-04 MED ORDER — ONDANSETRON HCL 4 MG PO TABS: 4.0000 mg | ORAL_TABLET | Freq: Three times a day (TID) | ORAL | 0 refills | Status: DC | PRN

## 2023-08-04 MED ORDER — UMECLIDINIUM BROMIDE 62.5 MCG/ACT IN AEPB: 1.0000 | INHALATION_SPRAY | Freq: Every day | RESPIRATORY_TRACT | 2 refills | Status: DC

## 2023-08-04 MED ORDER — PANTOPRAZOLE SODIUM 40 MG PO TBEC: 40.0000 mg | DELAYED_RELEASE_TABLET | Freq: Two times a day (BID) | ORAL | 3 refills | Status: DC

## 2023-08-04 MED ORDER — ALBUTEROL SULFATE HFA 108 (90 BASE) MCG/ACT IN AERS: 2.0000 | INHALATION_SPRAY | Freq: Four times a day (QID) | RESPIRATORY_TRACT | 0 refills | Status: DC | PRN

## 2023-08-04 NOTE — Assessment & Plan Note (Addendum)
Continue Lexapro 50 mg.  Discussed and recommended counseling. Possible psych referral in future.

## 2023-08-04 NOTE — Assessment & Plan Note (Signed)
Referral to GI  

## 2023-08-04 NOTE — Assessment & Plan Note (Signed)
Recheck A1c. Currently stable-continue current regimen.

## 2023-08-04 NOTE — Assessment & Plan Note (Signed)
Stable; continue current regimen.

## 2023-08-04 NOTE — Patient Instructions (Signed)
It was nice to see you today!  As we discussed in clinic I will be referring you out to gastroenterology to work up for your "brown" vomiting and nausea, as well as your follow-up colonoscopy.  At this point I would recommend that you stop taking the [facility] powder, as this could exacerbate any bleeding.  As we discussed, if you begin to feel dizzy at all or begin to vomit up any bright red blood or if your vomit starts to get brown and lumpy, I would recommend that you go to the emergency department.  I recommend that you go get your labs drawn ASAP at the nearest [facility].  We will follow-up with you if anything is abnormal.  We will follow back up on your anxiety and depression in February.  I would recommend that you would search for counselors in the area, you may look into better help online if you feel that this would work for you.  If you have any problems before your next visit feel free to message me via MyChart (minor issues or questions) or call the office, otherwise you may reach out to schedule an office visit.  Thank you! Gerilyn Pilgrim Neill Jurewicz, PA-C

## 2023-08-04 NOTE — Progress Notes (Signed)
New Patient Office Visit  Subjective    Patient ID: Victoria Rose, female    DOB: 03-19-1961  Age: 62 y.o. MRN: [id number]  CC:  Chief Complaint  Patient presents with   Establish Care    HPI Victoria Rose presents to establish care. Prior PCP was Abner Greenspan? at [facility]. She notes that she requires refills of incruse and ventolin hfa. [employer].   Pain Management- Sees Dr.Rice for pain management for back and hip issues-gets steroid shots.  COPD- Stable.  Diagnosed around August 2024. Continue current regimen with Ventolin and Incruse.  Will refill today.  Other PMH: Hypertension (stable- continue regimen), HLD, DM (stable-continue regimen), Smoker, Prominent abdominal aortic pulastion (10/08/22 NEGATIVE AAA duplex: largest was 2.3cm), mild coronary artery calcifications on CT, aortic atherosclerosis.  FH: father- mouth cancer. Mother- copd, hypertension, dm, afib (been to heart doc).  Tobacco use: Current smoker, .5 ppd for 50 years.  Discussed cessation. Alcohol use: no Drug use: Smokes marijuana- 1 joint per day.  Marital status: no Employment: no, on disability  Screenings:  Colon Cancer: Referral to GI, make this an urgent referral due to nausea and vomiting with "brown" appearance. Previous colonoscopy from 2019 showed several polyps including tubular adenomas. Lung Cancer: 06/03/22 Low-dose Chest CT wo Contrast: negative Rec: continue annual screening with low dose chest CT wo Contrast, S modifier for coronary artery calcifications. Breast Cancer: 12/04/22 Mammogram: BIRADS 1: Negative. Rec: Screening in one year. Diabetes: A1c is indicated.  States last A1c was 6.0, will repeat. HLD: Indicated.  Will conduct and discuss ascvd risk score with results.   Acute Problems: Anxiety/Depression- See visit GAD and PHQ-9. Discussed counseling benefits in addition to medication. Will continue Zoloft 50 mg for now, but the patient does not feel  that this is working.  Will follow-up at physical.  Nausea/vomiting-for nausea, has trouble eating occasionally. 3-4 weeks. Feels that she has lost 20 lbs in the last 3 months. Vomiting 1-2 times per day. States that vomit is sometimes "brown". Has history of ulcer- was in hospital for it. Denies look of coffee ground. Takes aspirin once per day. Takes a BC powder once per week. Talked about hospital precautions due to possibility of danger of acute GI bleed.  Discussed with therapy with PPI.  I spent greater than 60 minutes in charting, counseling, educating, and management of this patient.  Outpatient Encounter Medications as of 08/04/2023  Medication Sig   albuterol (VENTOLIN HFA) 108 (90 Base) MCG/ACT inhaler Inhale 2 puffs into the lungs every 6 (six) hours as needed for wheezing or shortness of breath.   amitriptyline (ELAVIL) 25 MG tablet Take 25 mg by mouth at bedtime.   aspirin EC 81 MG tablet Take 81 mg by mouth daily. Swallow whole.   atorvastatin (LIPITOR) 20 MG tablet Take 20 mg by mouth daily.   ipratropium-albuterol (DUONEB) 0.5-2.5 (3) MG/3ML SOLN Take 3 mLs by nebulization every 6 (six) hours as needed (wheezing or shortness of breath).   lisinopril-hydrochlorothiazide (ZESTORETIC) 10-12.5 MG tablet Take 1 tablet by mouth daily.   naloxone (NARCAN) nasal spray 4 mg/0.1 mL Place 1 spray into the nose once.   ondansetron (ZOFRAN) 4 MG tablet Take 1 tablet (4 mg total) by mouth every 8 (eight) hours as needed for nausea or vomiting.   pantoprazole (PROTONIX) 40 MG tablet Take 1 tablet (40 mg total) by mouth 2 (two) times daily.   umeclidinium bromide (INCRUSE ELLIPTA) 62.5 MCG/ACT AEPB Inhale 1  puff into the lungs daily.   amLODipine (NORVASC) 5 MG tablet Take 5 mg by mouth daily.   Cholecalciferol (VITAMIN D3) 50 MCG (2000 UT) capsule Take 2,000 Units by mouth daily.   cyanocobalamin (VITAMIN B12) 1000 MCG/ML injection Inject 1,000 mcg into the muscle every 30 (thirty) days.    cyclobenzaprine (FLEXERIL) 10 MG tablet Take 10 mg by mouth every 6 (six) hours.   glipiZIDE (GLUCOTROL) 5 MG tablet Take 5 mg by mouth daily.   LORazepam (ATIVAN) 0.5 MG tablet Take 1 mg by mouth 2 (two) times daily.   metFORMIN (GLUCOPHAGE) 500 MG tablet Take 500 mg by mouth daily with breakfast. And takes 1000 mg in the afternoon   oxyCODONE (OXY IR/ROXICODONE) 5 MG immediate release tablet Take 5 mg by mouth 4 (four) times daily as needed for moderate pain or severe pain.   [DISCONTINUED] amitriptyline (ELAVIL) 50 MG tablet Take 50 mg by mouth at bedtime.    [DISCONTINUED] PROAIR HFA 108 (90 BASE) MCG/ACT inhaler Inhale 1-2 puffs into the lungs every 4 (four) hours as needed for wheezing or shortness of breath.    [DISCONTINUED] 0.9 %  sodium chloride infusion    No facility-administered encounter medications on file as of 08/04/2023.    Past Medical History:  Diagnosis Date   Abnormal EKG    Anxiety 07/24/2015   Anxiety disorder    Arthritis    hips   Asthma    Benign hypertension 12/01/2017   Bursitis of hip 12/01/2017   Chronic hip pain    Closed head injury 05/08/2018   Diabetes mellitus (HCC) 12/01/2017   Diabetes mellitus without complication (HCC)    Essential (primary) hypertension    Hyperlipidemia    Hypertension    Hypertriglyceridemia 12/01/2017   Polycythemia, secondary 06/03/2021   Spinal stenosis 12/01/2017   Type 2 diabetes mellitus without complications (HCC)    Vitamin D deficiency    Xerosis cutis     Past Surgical History:  Procedure Laterality Date   CESAREAN SECTION  0454,0981   x2;during 1988 c section "has to be shocked 3 times to get heart going"    MULTIPLE TOOTH EXTRACTIONS  2014   under anesthesia all teeth pulled   STERIOD INJECTION     in back under anesthesia    UPPER GASTROINTESTINAL ENDOSCOPY  09/2009   in [place]-bleeding ulcers    Family History  Problem Relation Age of Onset   Diabetes Mother    Asthma Mother    Heart disease  Mother    Colon cancer Neg Hx    Esophageal cancer Neg Hx    Stomach cancer Neg Hx    Breast cancer Neg Hx     Social History   Socioeconomic History   Marital status: Single    Spouse name: Not on file   Number of children: Not on file   Years of education: Not on file   Highest education level: Not on file  Occupational History   Not on file  Tobacco Use   Smoking status: Every Day    Current packs/day: 0.50    Types: Cigarettes   Smokeless tobacco: Never  Vaping Use   Vaping status: Never Used  Substance and Sexual Activity   Alcohol use: Not Currently    Alcohol/week: 0.0 standard drinks of alcohol    Comment: occasionally   Drug use: No   Sexual activity: Not on file  Other Topics Concern   Not on file  Social History Narrative  Not on file   Social Drivers of Health   Financial Resource Strain: Not on file  Food Insecurity: Not on file  Transportation Needs: Not on file  Physical Activity: Not on file  Stress: Not on file  Social Connections: Not on file  Intimate Partner Violence: Not on file    ROS      Objective    BP 131/79   Pulse 89   Temp 98.2 F (36.8 C) (Oral)   Ht 5\' 4"  (1.626 m)   Wt 135 lb 1.6 oz (61.3 kg)   SpO2 96%   BMI 23.19 kg/m   Physical Exam Constitutional:      General: She is not in acute distress.    Appearance: Normal appearance. She is not ill-appearing.  HENT:     Head: Normocephalic and atraumatic.     Right Ear: External ear normal.     Left Ear: External ear normal.     Nose: Nose normal.     Mouth/Throat:     Mouth: Mucous membranes are moist.     Pharynx: Oropharynx is clear.  Eyes:     General: No scleral icterus.    Extraocular Movements: Extraocular movements intact.     Conjunctiva/sclera: Conjunctivae normal.     Pupils: Pupils are equal, round, and reactive to light.  Cardiovascular:     Rate and Rhythm: Normal rate and regular rhythm.     Pulses: Normal pulses.     Heart sounds: Normal  heart sounds. No murmur heard.    No friction rub.  Pulmonary:     Effort: Pulmonary effort is normal. No respiratory distress.     Breath sounds: Rhonchi present. No wheezing or rales.     Comments: Coarse rhonchi in all lung fields- history of copd. Musculoskeletal:        General: Normal range of motion.     Right lower leg: No edema.     Left lower leg: No edema.  Neurological:     Mental Status: She is alert.        Assessment & Plan:   Encounter to establish care  Essential (primary) hypertension Assessment & Plan: Stable-continue current regimen.  Orders: -     Comprehensive metabolic panel  Hyperlipidemia, unspecified hyperlipidemia type -     Lipid panel; Future  Type 2 diabetes mellitus without complication, without long-term current use of insulin (HCC) Assessment & Plan: Recheck A1c. Currently stable-continue current regimen.  Orders: -     Hemoglobin A1c; Future -     Microalbumin / creatinine urine ratio; Future -     Comprehensive metabolic panel  Colon cancer screening Assessment & Plan: .  Referral to GI  Orders: -     Ambulatory referral to Gastroenterology  Anxiety  Nausea and vomiting, unspecified vomiting type Assessment & Plan: Discussed emergency precautions. Ordered stat CBC and CMP. Referral to GI for colonoscopy and possible endoscopy. Ordered PPI therapy for possible GI ulcer. Ordered Zofran for continued nausea. Short follow-up time for January. Continue to monitor. Stop all NSAID use.  Orders: -     Ambulatory referral to Gastroenterology -     Pantoprazole Sodium; Take 1 tablet (40 mg total) by mouth 2 (two) times daily.  Dispense: 60 tablet; Refill: 3 -     Ondansetron HCl; Take 1 tablet (4 mg total) by mouth every 8 (eight) hours as needed for nausea or vomiting.  Dispense: 20 tablet; Refill: 0 -     CBC with Differential/Platelet  Chronic obstructive pulmonary disease, unspecified COPD type (HCC) Assessment &  Plan: Request PFT results. Continue Incruse and Ventolin HFA.  Orders: -     Umeclidinium Bromide; Inhale 1 puff into the lungs daily.  Dispense: 30 each; Refill: 2 -     Albuterol Sulfate HFA; Inhale 2 puffs into the lungs every 6 (six) hours as needed for wheezing or shortness of breath.  Dispense: 8 g; Refill: 0    Return in about 4 weeks (around 09/01/2023) for n/v.   Gerilyn Pilgrim T Raymir Frommelt, PA-C

## 2023-08-04 NOTE — Assessment & Plan Note (Addendum)
Discussed emergency precautions. Ordered stat CBC and CMP. Referral to GI for colonoscopy and possible endoscopy. Ordered PPI therapy for possible GI ulcer. Ordered Zofran for continued nausea. Short follow-up time for January. Continue to monitor. Stop all NSAID use.

## 2023-08-04 NOTE — Assessment & Plan Note (Signed)
Request PFT results. Continue Incruse and Ventolin HFA.

## 2023-08-05 ENCOUNTER — Encounter: Payer: Self-pay | Admitting: Gastroenterology

## 2023-08-05 ENCOUNTER — Telehealth (HOSPITAL_BASED_OUTPATIENT_CLINIC_OR_DEPARTMENT_OTHER): Payer: Self-pay | Admitting: Student

## 2023-08-05 ENCOUNTER — Encounter (HOSPITAL_BASED_OUTPATIENT_CLINIC_OR_DEPARTMENT_OTHER): Payer: Self-pay | Admitting: Student

## 2023-08-05 LAB — CBC WITH DIFFERENTIAL/PLATELET
Basophils Absolute: 0.1 10*3/uL (ref 0.0–0.2)
Basos: 0 %
EOS (ABSOLUTE): 0.1 10*3/uL (ref 0.0–0.4)
Eos: 0 %
Hematocrit: 48.3 % — ABNORMAL HIGH (ref 34.0–46.6)
Hemoglobin: 16.2 g/dL — ABNORMAL HIGH (ref 11.1–15.9)
Immature Grans (Abs): 0.3 10*3/uL — ABNORMAL HIGH (ref 0.0–0.1)
Immature Granulocytes: 2 %
Lymphocytes Absolute: 2 10*3/uL (ref 0.7–3.1)
Lymphs: 12 %
MCH: 30.3 pg (ref 26.6–33.0)
MCHC: 33.5 g/dL (ref 31.5–35.7)
MCV: 90 fL (ref 79–97)
Monocytes Absolute: 1.1 10*3/uL — ABNORMAL HIGH (ref 0.1–0.9)
Monocytes: 6 %
Neutrophils Absolute: 13.5 10*3/uL — ABNORMAL HIGH (ref 1.4–7.0)
Neutrophils: 80 %
Platelets: 228 10*3/uL (ref 150–450)
RBC: 5.35 x10E6/uL — ABNORMAL HIGH (ref 3.77–5.28)
RDW: 13 % (ref 11.7–15.4)
WBC: 16.9 10*3/uL — ABNORMAL HIGH (ref 3.4–10.8)

## 2023-08-05 NOTE — Telephone Encounter (Signed)
Called patient twice with no answer, left a message making sure she was following up urgently with GI regarding frequent nausea and vomiting. Emphasized not to take any more BC powders or aspirin at this time. Also emphasized importance of getting stat labs drawn.

## 2023-08-05 NOTE — Telephone Encounter (Signed)
Patient called to inform Dr. Ranae Plumber that she has scheduled her colonoscopy and had blood work done this morning.

## 2023-08-06 ENCOUNTER — Other Ambulatory Visit (HOSPITAL_BASED_OUTPATIENT_CLINIC_OR_DEPARTMENT_OTHER): Payer: Self-pay | Admitting: Student

## 2023-08-06 DIAGNOSIS — R112 Nausea with vomiting, unspecified: Secondary | ICD-10-CM

## 2023-08-06 LAB — COMPREHENSIVE METABOLIC PANEL
ALT: 9 [IU]/L (ref 0–32)
AST: 9 [IU]/L (ref 0–40)
Albumin: 4.5 g/dL (ref 3.9–4.9)
Alkaline Phosphatase: 64 [IU]/L (ref 44–121)
BUN/Creatinine Ratio: 13 (ref 12–28)
BUN: 12 mg/dL (ref 8–27)
Bilirubin Total: 0.4 mg/dL (ref 0.0–1.2)
CO2: 22 mmol/L (ref 20–29)
Calcium: 10.1 mg/dL (ref 8.7–10.3)
Chloride: 99 mmol/L (ref 96–106)
Creatinine, Ser: 0.91 mg/dL (ref 0.57–1.00)
Globulin, Total: 2.3 g/dL (ref 1.5–4.5)
Glucose: 121 mg/dL — ABNORMAL HIGH (ref 70–99)
Potassium: 4 mmol/L (ref 3.5–5.2)
Sodium: 139 mmol/L (ref 134–144)
Total Protein: 6.8 g/dL (ref 6.0–8.5)
eGFR: 70 mL/min/{1.73_m2} (ref >59)

## 2023-08-06 NOTE — Progress Notes (Signed)
Discussed labs with patient via telephone. Ordering CT abd and pelvis to rule out intestinal obstruction.

## 2023-08-22 ENCOUNTER — Encounter: Payer: Self-pay | Admitting: Student

## 2023-08-22 ENCOUNTER — Telehealth (HOSPITAL_BASED_OUTPATIENT_CLINIC_OR_DEPARTMENT_OTHER): Payer: Self-pay | Admitting: Student

## 2023-08-22 DIAGNOSIS — D7389 Other diseases of spleen: Secondary | ICD-10-CM

## 2023-08-22 DIAGNOSIS — D72828 Other elevated white blood cell count: Secondary | ICD-10-CM

## 2023-08-22 DIAGNOSIS — R112 Nausea with vomiting, unspecified: Secondary | ICD-10-CM

## 2023-08-22 DIAGNOSIS — K769 Liver disease, unspecified: Secondary | ICD-10-CM

## 2023-08-22 LAB — CBC WITH DIFFERENTIAL/PLATELET
Basophils Absolute: 0.1 10*3/uL (ref 0.0–0.2)
Basos: 1 %
EOS (ABSOLUTE): 0.1 10*3/uL (ref 0.0–0.4)
Eos: 1 %
Hematocrit: 46.4 % (ref 34.0–46.6)
Hemoglobin: 15.8 g/dL (ref 11.1–15.9)
Immature Grans (Abs): 0.1 10*3/uL (ref 0.0–0.1)
Immature Granulocytes: 2 %
Lymphocytes Absolute: 2.4 10*3/uL (ref 0.7–3.1)
Lymphs: 30 %
MCH: 30.9 pg (ref 26.6–33.0)
MCHC: 34.1 g/dL (ref 31.5–35.7)
MCV: 91 fL (ref 79–97)
Monocytes Absolute: 0.7 10*3/uL (ref 0.1–0.9)
Monocytes: 8 %
Neutrophils Absolute: 4.8 10*3/uL (ref 1.4–7.0)
Neutrophils: 58 %
Platelets: 239 10*3/uL (ref 150–450)
RBC: 5.12 x10E6/uL (ref 3.77–5.28)
RDW: 13.1 % (ref 11.7–15.4)
WBC: 8.2 10*3/uL (ref 3.4–10.8)

## 2023-08-22 MED ORDER — ONDANSETRON HCL 4 MG PO TABS: 4.0000 mg | ORAL_TABLET | Freq: Three times a day (TID) | ORAL | 1 refills | Status: AC | PRN

## 2023-08-22 NOTE — Telephone Encounter (Signed)
 Patient called requesting a prescription of Zofran. Patient stated she is still having a hard time keeping food down. Please advise.

## 2023-08-22 NOTE — Telephone Encounter (Signed)
 Discussed CT results over the phone. Patient is still having vomiting but it is normally only one day per week but around 3-4 times in that day. Notes no color associated with vomit, no longer brown but notes that it looks like mucous. Patient was advised to stop her once weekly marijuana use, patient was agreeable.   Future visit with GI. Refill Zofran . Per Radiology will Order Abdomen U/S with elastography to assess for hepatic hemangioma, splenic hemangioma, and hepatic fibrosis. Will reorder CBC to reassess WBC count.  Patient is to continue PPI therapy.

## 2023-09-02 ENCOUNTER — Ambulatory Visit (HOSPITAL_BASED_OUTPATIENT_CLINIC_OR_DEPARTMENT_OTHER): Payer: Medicaid Other

## 2023-09-02 ENCOUNTER — Telehealth (HOSPITAL_BASED_OUTPATIENT_CLINIC_OR_DEPARTMENT_OTHER): Payer: Self-pay

## 2023-09-02 DIAGNOSIS — E119 Type 2 diabetes mellitus without complications: Secondary | ICD-10-CM | POA: Diagnosis not present

## 2023-09-02 DIAGNOSIS — E785 Hyperlipidemia, unspecified: Secondary | ICD-10-CM

## 2023-09-03 LAB — HEMOGLOBIN A1C
Est. average glucose Bld gHb Est-mCnc: 108 mg/dL
Hgb A1c MFr Bld: 5.4 % (ref 4.8–5.6)

## 2023-09-03 LAB — LIPID PANEL
Chol/HDL Ratio: 2.2 {ratio} (ref 0.0–4.4)
Cholesterol, Total: 143 mg/dL (ref 100–199)
HDL: 64 mg/dL (ref >39)
LDL Chol Calc (NIH): 63 mg/dL (ref 0–99)
Triglycerides: 83 mg/dL (ref 0–149)
VLDL Cholesterol Cal: 16 mg/dL (ref 5–40)

## 2023-09-03 LAB — MICROALBUMIN / CREATININE URINE RATIO
Creatinine, Urine: 12.5 mg/dL
Microalb/Creat Ratio: <24 mg/g{creat} (ref 0–29)
Microalbumin, Urine: <3 ug/mL

## 2023-09-03 NOTE — Progress Notes (Signed)
 Nurse visit

## 2023-09-09 ENCOUNTER — Ambulatory Visit (HOSPITAL_BASED_OUTPATIENT_CLINIC_OR_DEPARTMENT_OTHER)
Admission: RE | Admit: 2023-09-09 | Discharge: 2023-09-09 | Disposition: A | Discharge status: Home / Self Care | Payer: Medicaid Other | Source: Ambulatory Visit | Attending: Student | Admitting: Student

## 2023-09-09 DIAGNOSIS — D7389 Other diseases of spleen: Secondary | ICD-10-CM | POA: Insufficient documentation

## 2023-09-09 DIAGNOSIS — K769 Liver disease, unspecified: Secondary | ICD-10-CM | POA: Insufficient documentation

## 2023-09-09 DIAGNOSIS — R112 Nausea with vomiting, unspecified: Secondary | ICD-10-CM | POA: Insufficient documentation

## 2023-09-10 ENCOUNTER — Telehealth: Payer: Self-pay

## 2023-09-10 ENCOUNTER — Ambulatory Visit: Payer: Medicaid Other

## 2023-09-10 VITALS — Ht 64.0 in | Wt 135.0 lb

## 2023-09-10 DIAGNOSIS — Z860100 Personal history of colon polyps, unspecified: Secondary | ICD-10-CM

## 2023-09-10 MED ORDER — SUFLAVE 178.7 G PO SOLR: 1.0000 | Freq: Once | ORAL | 0 refills | Status: AC

## 2023-09-10 NOTE — Progress Notes (Signed)
No egg or soy allergy known to patient  No issues known to pt with past sedation with any surgeries or procedures Patient denies ever being told they had issues or difficulty with intubation  No FH of Malignant Hyperthermia Pt is not on diet pills Pt is not on  home 02  Pt is not on blood thinners  Pt denies issues with constipation  No A fib or A flutter Have any cardiac testing pending--no Pt can ambulate independently Pt denies use of chewing tobacco Discussed diabetic I weight loss medication holds Discussed NSAID holds Checked BMI Pt instructed to use [employer] or GoodRx for a price reduction on prep  Patient's chart reviewed by Victoria Rose CNRA prior to previsit and patient appropriate for the [facility].  Pre visit completed and red dot placed by patient's name on their procedure day (on provider's schedule).

## 2023-09-10 NOTE — Telephone Encounter (Signed)
Patient disclosed during pre visit that she had a heart attack in 1988 post delivery.  She was told that "it had something to do with anesthesia".  Her ECHO show EF is 60-65% and no aortic stenosis

## 2023-09-11 ENCOUNTER — Other Ambulatory Visit: Payer: Self-pay

## 2023-09-11 ENCOUNTER — Telehealth: Payer: Self-pay | Admitting: Gastroenterology

## 2023-09-11 DIAGNOSIS — Z860100 Personal history of colon polyps, unspecified: Secondary | ICD-10-CM

## 2023-09-11 MED ORDER — PEG 3350-KCL-NA BICARB-NACL 420 G PO SOLR: 4000.0000 mL | Freq: Once | ORAL | 0 refills | Status: AC

## 2023-09-11 NOTE — Telephone Encounter (Signed)
Patient called stating she was not able to afford Suflave medication requesting alternative. Thank you.

## 2023-09-12 MED ORDER — PEG 3350-KCL-NA BICARB-NACL 420 G PO SOLR: 4000.0000 mL | Freq: Once | ORAL | 0 refills | Status: AC

## 2023-09-12 NOTE — Telephone Encounter (Signed)
Golytely sent to pharmacy and new prep instructions sent in My Chart

## 2023-10-03 ENCOUNTER — Encounter: Payer: Self-pay | Admitting: Gastroenterology

## 2023-10-03 ENCOUNTER — Ambulatory Visit: Payer: Medicaid Other | Admitting: Gastroenterology

## 2023-10-03 VITALS — BP 148/88 | HR 88 | Temp 98.0°F | Resp 17 | Ht 64.0 in | Wt 135.0 lb

## 2023-10-03 DIAGNOSIS — K644 Residual hemorrhoidal skin tags: Secondary | ICD-10-CM | POA: Diagnosis not present

## 2023-10-03 DIAGNOSIS — K648 Other hemorrhoids: Secondary | ICD-10-CM

## 2023-10-03 DIAGNOSIS — K621 Rectal polyp: Secondary | ICD-10-CM | POA: Diagnosis not present

## 2023-10-03 DIAGNOSIS — D125 Benign neoplasm of sigmoid colon: Secondary | ICD-10-CM

## 2023-10-03 DIAGNOSIS — Z1211 Encounter for screening for malignant neoplasm of colon: Secondary | ICD-10-CM

## 2023-10-03 DIAGNOSIS — D128 Benign neoplasm of rectum: Secondary | ICD-10-CM

## 2023-10-03 DIAGNOSIS — Z860100 Personal history of colon polyps, unspecified: Secondary | ICD-10-CM

## 2023-10-03 MED ORDER — SODIUM CHLORIDE 0.9 % IV SOLN
500.0000 mL | Freq: Once | INTRAVENOUS | Status: DC
Start: 2023-10-03 — End: 2023-10-03

## 2023-10-03 NOTE — Progress Notes (Signed)
Pt's states no medical or surgical changes since previsit or office visit.

## 2023-10-03 NOTE — Progress Notes (Signed)
[facility] History and Physical   Primary Care Physician:  Rothfuss, Teryl Lucy, PA-C   Reason for Procedure:  History of adenomatous colon polyps  Plan:    Surveillance colonoscopy with possible interventions as needed     HPI: Victoria Rose is a very pleasant 63 y.o. female here for surveillance colonoscopy. Denies any nausea, vomiting, abdominal pain, melena or bright red blood per rectum  The risks and benefits as well as alternatives of endoscopic procedure(s) have been discussed and reviewed. All questions answered. The patient agrees to proceed.    Past Medical History:  Diagnosis Date   Abnormal EKG    Anxiety 07/24/2015   Anxiety disorder    Arthritis    hips   Asthma    Benign hypertension 12/01/2017   Bursitis of hip 12/01/2017   Chronic hip pain    Closed head injury 05/08/2018   COPD (chronic obstructive pulmonary disease) (HCC)    Diabetes mellitus (HCC) 12/01/2017   Diabetes mellitus without complication (HCC)    Essential (primary) hypertension    Hyperlipidemia    Hypertension    Hypertriglyceridemia 12/01/2017   Myocardial infarction (HCC)    Polycythemia, secondary 06/03/2021   Spinal stenosis 12/01/2017   Type 2 diabetes mellitus without complications (HCC)    Vitamin D deficiency    Xerosis cutis     Past Surgical History:  Procedure Laterality Date   CESAREAN SECTION  7846,9629   x2;during 1988 c section "has to be shocked 3 times to get heart going"    MULTIPLE TOOTH EXTRACTIONS  2014   under anesthesia all teeth pulled   STERIOD INJECTION     in back under anesthesia    UPPER GASTROINTESTINAL ENDOSCOPY  09/2009   in [place]-bleeding ulcers    Prior to Admission medications   Medication Sig Start Date End Date Taking? Authorizing Provider  albuterol (VENTOLIN HFA) 108 (90 Base) MCG/ACT inhaler Inhale 2 puffs into the lungs every 6 (six) hours as needed for wheezing or shortness of breath. 08/04/23   Rothfuss, Teryl Lucy, PA-C   amitriptyline (ELAVIL) 25 MG tablet Take 25 mg by mouth at bedtime. 07/31/23   [provider]  amLODipine (NORVASC) 10 MG tablet Take 10 mg by mouth daily. 07/14/23   [provider]  atorvastatin (LIPITOR) 20 MG tablet Take 20 mg by mouth daily. 10/28/14   [provider]  ipratropium-albuterol (DUONEB) 0.5-2.5 (3) MG/3ML SOLN Take 3 mLs by nebulization every 6 (six) hours as needed (wheezing or shortness of breath). 07/22/22   [provider]  lisinopril-hydrochlorothiazide (ZESTORETIC) 10-12.5 MG tablet Take 1 tablet by mouth daily. 06/13/22   [provider]  metFORMIN (GLUCOPHAGE) 1000 MG tablet Take 1,000 mg by mouth 2 (two) times daily. 07/21/23   [provider]  naloxone [facility]) nasal spray 4 mg/0.1 mL Place 1 spray into the nose once.    [provider]  ondansetron (ZOFRAN) 4 MG tablet Take 1 tablet (4 mg total) by mouth every 8 (eight) hours as needed for nausea or vomiting. 08/22/23 10/21/23  Rothfuss, Teryl Lucy, PA-C  oxyCODONE-acetaminophen (PERCOCET) 7.5-325 MG tablet Take 1 tablet by mouth 4 (four) times daily. 07/27/23   [provider]  pantoprazole (PROTONIX) 40 MG tablet Take 1 tablet (40 mg total) by mouth 2 (two) times daily. 08/04/23   Rothfuss, Teryl Lucy, PA-C  sertraline (ZOLOFT) 50 MG tablet Take 50 mg by mouth daily. 07/31/23   [provider]  umeclidinium bromide (INCRUSE ELLIPTA) 62.5  MCG/ACT AEPB Inhale 1 puff into the lungs daily. 08/04/23 11/02/23  Rothfuss, Teryl Lucy, PA-C    Current Outpatient Medications  Medication Sig Dispense Refill   albuterol (VENTOLIN HFA) 108 (90 Base) MCG/ACT inhaler Inhale 2 puffs into the lungs every 6 (six) hours as needed for wheezing or shortness of breath. 8 g 0   amitriptyline (ELAVIL) 25 MG tablet Take 25 mg by mouth at bedtime.     amLODipine (NORVASC) 10 MG tablet Take 10 mg by mouth daily.     atorvastatin (LIPITOR) 20 MG tablet Take 20 mg by mouth daily.   0   ipratropium-albuterol (DUONEB) 0.5-2.5 (3) MG/3ML SOLN Take 3 mLs by nebulization every 6 (six) hours as needed (wheezing or shortness of breath).     lisinopril-hydrochlorothiazide (ZESTORETIC) 10-12.5 MG tablet Take 1 tablet by mouth daily.     metFORMIN (GLUCOPHAGE) 1000 MG tablet Take 1,000 mg by mouth 2 (two) times daily.     naloxone (NARCAN) nasal spray 4 mg/0.1 mL Place 1 spray into the nose once.     ondansetron (ZOFRAN) 4 MG tablet Take 1 tablet (4 mg total) by mouth every 8 (eight) hours as needed for nausea or vomiting. 90 tablet 1   oxyCODONE-acetaminophen (PERCOCET) 7.5-325 MG tablet Take 1 tablet by mouth 4 (four) times daily.     pantoprazole (PROTONIX) 40 MG tablet Take 1 tablet (40 mg total) by mouth 2 (two) times daily. 60 tablet 3   sertraline (ZOLOFT) 50 MG tablet Take 50 mg by mouth daily.     umeclidinium bromide (INCRUSE ELLIPTA) 62.5 MCG/ACT AEPB Inhale 1 puff into the lungs daily. 30 each 2   No current facility-administered medications for this visit.    Allergies as of 10/03/2023 - Review Complete 09/10/2023  Allergen Reaction Noted   Metoprolol Anaphylaxis 08/30/2022   Aspirin Other (See Comments) 01/13/2015   Nsaids Other (See Comments) 01/13/2015    Family History  Problem Relation Age of Onset   Diabetes Mother    Asthma Mother    Heart disease Mother    Colon cancer Neg Hx    Esophageal cancer Neg Hx    Stomach cancer Neg Hx    Breast cancer Neg Hx    Colon polyps Neg Hx    Rectal cancer Neg Hx     Social History   Socioeconomic History   Marital status: Single    Spouse name: Not on file   Number of children: Not on file   Years of education: Not on file   Highest education level: Not on file  Occupational History   Not on file  Tobacco Use   Smoking status: Every Day    Current packs/day: 0.50    Types: Cigarettes   Smokeless tobacco: Never  Vaping Use   Vaping status: Never Used  Substance and Sexual Activity   Alcohol use:  Not Currently    Alcohol/week: 0.0 standard drinks of alcohol    Comment: occasionally   Drug use: No   Sexual activity: Not on file  Other Topics Concern   Not on file  Social History Narrative   Not on file   Social Drivers of Health   Financial Resource Strain: Not on file  Food Insecurity: Not on file  Transportation Needs: Not on file  Physical Activity: Not on file  Stress: Not on file  Social Connections: Not on file  Intimate Partner Violence: Not on file    Review of Systems:  All other review  of systems negative except as mentioned in the HPI.  Physical Exam: Vital signs in last 24 hours: There were no vitals taken for this visit. General:   Alert, NAD Lungs:  Clear .   Heart:  Regular rate and rhythm Abdomen:  Soft, nontender and nondistended. Neuro/Psych:  Alert and cooperative. Normal mood and affect. A and O x 3  Reviewed labs, radiology imaging, old records and pertinent past GI work up  Patient is appropriate for planned procedure(s) and anesthesia in an ambulatory setting   K. Scherry Ran , MD [phone]

## 2023-10-03 NOTE — Op Note (Signed)
[facility] Patient Name: Victoria Rose Procedure Date: 10/03/2023 10:57 AM MRN: [id number] Endoscopist: Napoleon Form , MD, [id number] Age: 63 Referring MD:  Date of Birth: [birth year] Gender: Female Account #: [id number] Procedure:                Colonoscopy Indications:              High risk colon cancer surveillance: Personal                            history of adenoma (10 mm or greater in size), High                            risk colon cancer surveillance: Personal history of                            multiple (3 or more) adenomas. Incidental                            significant weight loss >30 lbs in 3 months Medicines:                Monitored Anesthesia Care Procedure:                Pre-Anesthesia Assessment:                           - Prior to the procedure, a History and Physical                            was performed, and patient medications and                            allergies were reviewed. The patient's tolerance of                            previous anesthesia was also reviewed. The risks                            and benefits of the procedure and the sedation                            options and risks were discussed with the patient.                            All questions were answered, and informed consent                            was obtained. Prior Anticoagulants: The patient has                            taken no anticoagulant or antiplatelet agents. ASA                            Grade Assessment: III - A patient with severe  systemic disease. After reviewing the risks and                            benefits, the patient was deemed in satisfactory                            condition to undergo the procedure.                           After obtaining informed consent, the colonoscope                            was passed under direct vision. Throughout the                            procedure,  the patient's blood pressure, pulse, and                            oxygen saturations were monitored continuously. The                            [id number]-HQ190L Colonoscope [id number] was introduced                            through the anus and advanced to the the cecum,                            identified by appendiceal orifice and ileocecal                            valve. The colonoscopy was performed without                            difficulty. The patient tolerated the procedure                            well. The quality of the bowel preparation was                            good. The terminal ileum, ileocecal valve,                            appendiceal orifice, and rectum were photographed. Scope In: 11:05:26 AM Scope Out: 11:23:32 AM Scope Withdrawal Time: 0 hours 12 minutes 25 seconds  Total Procedure Duration: 0 hours 18 minutes 6 seconds  Findings:                 The perianal and digital rectal examinations were                            normal.                           Four sessile polyps were found in the rectum and  sigmoid colon. The polyps were 10 to 12 mm in size.                            These polyps were removed with a hot snare.                            Resection and retrieval were complete.                           Non-bleeding external and internal hemorrhoids were                            found during retroflexion. The hemorrhoids were                            medium-sized. Complications:            No immediate complications. Estimated Blood Loss:     Estimated blood loss was minimal. Impression:               - Four 10 to 12 mm polyps in the rectum and in the                            sigmoid colon, removed with a hot snare. Resected                            and retrieved.                           - Non-bleeding external and internal hemorrhoids. Recommendation:           - Patient has a contact number available for                             emergencies. The signs and symptoms of potential                            delayed complications were discussed with the                            patient. Return to normal activities tomorrow.                            Written discharge instructions were provided to the                            patient.                           - Resume previous diet.                           - Continue present medications.                           - Await pathology results.                           -  Repeat colonoscopy in 3 - 5 years for                            surveillance based on pathology results. Napoleon Form, MD 10/03/2023 11:33:29 AM This report has been signed electronically.

## 2023-10-03 NOTE — Progress Notes (Signed)
A/ox3, pleased with MAC, report to RN

## 2023-10-03 NOTE — Patient Instructions (Signed)

## 2023-10-03 NOTE — Progress Notes (Signed)
Called to room to assist during endoscopic procedure.  Patient ID and intended procedure confirmed with present staff. Received instructions for my participation in the procedure from the performing physician.

## 2023-10-06 ENCOUNTER — Telehealth: Payer: Self-pay

## 2023-10-06 NOTE — Telephone Encounter (Signed)
  Follow up Call-     10/03/2023   10:49 AM  Call back number  Post procedure Call Back phone  # [phone]  Permission to leave phone message Yes     Patient questions:  Do you have a fever, pain , or abdominal swelling? No. Pain Score  0 *  Have you tolerated food without any problems? Yes.    Have you been able to return to your normal activities? Yes.    Do you have any questions about your discharge instructions: Diet   No. Medications  No. Follow up visit  No.  Do you have questions or concerns about your Care? No.  Actions: * If pain score is 4 or above: No action needed, pain <4.

## 2023-10-07 ENCOUNTER — Other Ambulatory Visit (HOSPITAL_BASED_OUTPATIENT_CLINIC_OR_DEPARTMENT_OTHER): Payer: Self-pay | Admitting: Student

## 2023-10-07 DIAGNOSIS — J449 Chronic obstructive pulmonary disease, unspecified: Secondary | ICD-10-CM

## 2023-10-07 LAB — SURGICAL PATHOLOGY

## 2023-10-08 ENCOUNTER — Encounter (HOSPITAL_BASED_OUTPATIENT_CLINIC_OR_DEPARTMENT_OTHER): Payer: Medicaid Other | Admitting: Student

## 2023-10-08 MED ORDER — AMITRIPTYLINE HCL 25 MG PO TABS: 25.0000 mg | ORAL_TABLET | Freq: Every day | ORAL | 4 refills | Status: DC

## 2023-10-08 MED ORDER — LISINOPRIL-HYDROCHLOROTHIAZIDE 10-12.5 MG PO TABS: 1.0000 | ORAL_TABLET | Freq: Every day | ORAL | 3 refills | Status: DC

## 2023-10-08 MED ORDER — ATORVASTATIN CALCIUM 20 MG PO TABS: 20.0000 mg | ORAL_TABLET | Freq: Every day | ORAL | 3 refills | Status: AC

## 2023-10-08 MED ORDER — AMLODIPINE BESYLATE 10 MG PO TABS: 10.0000 mg | ORAL_TABLET | Freq: Every day | ORAL | 4 refills | Status: DC

## 2023-10-08 MED ORDER — ALBUTEROL SULFATE HFA 108 (90 BASE) MCG/ACT IN AERS: 2.0000 | INHALATION_SPRAY | Freq: Four times a day (QID) | RESPIRATORY_TRACT | 0 refills | Status: DC | PRN

## 2023-10-08 MED ORDER — METFORMIN HCL 1000 MG PO TABS
1000.0000 mg | ORAL_TABLET | Freq: Two times a day (BID) | ORAL | 4 refills | Status: DC
Start: 2023-10-08 — End: 2024-01-13

## 2023-10-08 MED ORDER — SERTRALINE HCL 50 MG PO TABS: 50.0000 mg | ORAL_TABLET | Freq: Every day | ORAL | 4 refills | Status: DC

## 2023-10-10 ENCOUNTER — Encounter (HOSPITAL_BASED_OUTPATIENT_CLINIC_OR_DEPARTMENT_OTHER): Payer: Self-pay | Admitting: Student

## 2023-10-10 ENCOUNTER — Ambulatory Visit (HOSPITAL_BASED_OUTPATIENT_CLINIC_OR_DEPARTMENT_OTHER): Payer: Medicaid Other | Admitting: Student

## 2023-10-10 VITALS — BP 114/71 | HR 78 | Temp 98.0°F | Ht 64.0 in | Wt 135.9 lb

## 2023-10-10 DIAGNOSIS — F17200 Nicotine dependence, unspecified, uncomplicated: Secondary | ICD-10-CM | POA: Diagnosis not present

## 2023-10-10 DIAGNOSIS — F419 Anxiety disorder, unspecified: Secondary | ICD-10-CM | POA: Diagnosis not present

## 2023-10-10 DIAGNOSIS — Z0000 Encounter for general adult medical examination without abnormal findings: Secondary | ICD-10-CM | POA: Diagnosis not present

## 2023-10-10 DIAGNOSIS — J449 Chronic obstructive pulmonary disease, unspecified: Secondary | ICD-10-CM

## 2023-10-10 MED ORDER — SERTRALINE HCL 100 MG PO TABS: 100.0000 mg | ORAL_TABLET | Freq: Every day | ORAL | 5 refills | Status: DC

## 2023-10-10 MED ORDER — VARENICLINE TARTRATE (STARTER) 0.5 MG X 11 & 1 MG X 42 PO TBPK
ORAL_TABLET | ORAL | 0 refills | Status: DC
Start: 2023-10-10 — End: 2023-11-20

## 2023-10-10 NOTE — Assessment & Plan Note (Signed)
 May reperform PFTs at next visit.  Still awaiting results. Continue Incruse as maintenance and Ventolin HFA as rescue inhaler.

## 2023-10-10 NOTE — Patient Instructions (Addendum)
 It was nice to see you today!  Chantix (Varenicline) is a medication that is often used as a tool to help patients quit tobacco use. The medication works by preventing nicotine stimulation of mesolimbic dopamine system associated with nicotine use disorder- in other words it can prevent the nicotine from feeling "rewarding". The most common side effects include nausea, vomiting, abnormal dreams, depressed mood, and headaches. Patients with a history of seizures should not use varenicline because there could be a dose dependent reaction within the CNS system. Please let me know if there is any reason you don't tolerate this medication.  Dosing: Take after eating with a full glass of water. Start v?r?ni?li??, then quit on day 8 or start v?r??i?line, then quit tobacco use between days 8 to 35. Days 1 to 3: 0.5 mg once daily. Days 4 to 7: 0.5 mg twice daily. Days 8+ (maintenance dose): 1 mg twice daily   Duration: Continue maintenance dose for at least 11 weeks (for a total of at least 12 weeks of treatment).  If you have any problems before your next visit feel free to message me via MyChart (minor issues or questions) or call the office, otherwise you may reach out to schedule an office visit.  Thank you! Gerilyn Pilgrim Yahsir Wickens, PA-C

## 2023-10-10 NOTE — Assessment & Plan Note (Signed)
 Order Chantix. No history of seizure disorder or seizure activity. Provided with Chantix information.

## 2023-10-10 NOTE — Progress Notes (Signed)
 Complete physical exam  Patient: Victoria Rose   DOB: [birth year]   63 y.o. Female  MRN: [id number]  Subjective:    Chief Complaint  Patient presents with   Annual Exam    Pt. Here for a annual exam.    Nicotine Dependence    Would like to discuss quit smoking.     Medication Refill    Refill on Zoloft    Victoria Rose is a 63 y.o. female who presents today for a complete physical exam. She reports consuming a general diet. The patient does not participate in regular exercise at present. Walks at home around house- notes that she stays active. She generally feels well. She reports sleeping fairly well, around 6-7 hours nightly- awakens to go to the bathroom about 3 times nightly. She does have additional problems to discuss today.   Tobacco Cessation- 0.5 ppd. Discussed possible options for pharmacotherapy.  Patient is agreeable to trying Chantix.  No history of seizure disorder.  Will plan to send in.  COPD- Stable on ventolin and incruse. Uses ventolin a couple of times per day. Has never seen a pulmonologist.  Discussed continuing maintenance Incruse daily.  Use Ventolin as needed.  May discuss adjunctive therapy at next visit.  Anxiety/depression-patient feels that anxiety is still not under control.  Discussed doubling current dose of SSRI.  If this does not work, discussed with patient about adding BuSpar at next visit.  Colonoscopy was done on Friday- 4 polyps removed. Still waiting back for final results.   Most recent fall risk assessment:    10/10/2023   10:25 AM  Fall Risk   Falls in the past year? 0  Number falls in past yr: 0  Injury with Fall? 0  Risk for fall due to : No Fall Risks  Follow up Falls evaluation completed     Most recent depression screenings:    08/04/2023    8:30 AM  PHQ 2/9 Scores  PHQ - 2 Score 4  PHQ- 9 Score 14   Vision:Within last year  Patient Active Problem List   Diagnosis Date Noted   Tobacco use disorder 10/10/2023    Nausea and vomiting 08/04/2023   Colon cancer screening 08/04/2023   Chronic obstructive pulmonary disease (HCC) 08/04/2023   Coronary artery calcification seen on CT scan 09/18/2022   Cigarette smoker 09/18/2022   Dyspnea on exertion 09/18/2022   Cardiac murmur 09/18/2022   Prominent abdominal aortic pulsation 09/18/2022   Abnormal EKG 08/30/2022   Anxiety disorder 08/30/2022   Chronic arthritis 08/30/2022   Chronic hip pain 08/30/2022   Diabetes mellitus without complication (HCC) 08/30/2022   Essential (primary) hypertension 08/30/2022   Hyperlipidemia 08/30/2022   Hypertension 08/30/2022   Type 2 diabetes mellitus without complications (HCC) 08/30/2022   Xerosis cutis 08/30/2022   Polycythemia, secondary 06/03/2021   Closed head injury 05/08/2018   Benign hypertension 12/01/2017   Bursitis of hip 12/01/2017   Diabetes mellitus (HCC) 12/01/2017   Hypertriglyceridemia 12/01/2017   Spinal stenosis 12/01/2017   Vitamin D deficiency 07/28/2015   Anxiety 07/24/2015   Past Medical History:  Diagnosis Date   Abnormal EKG    Anxiety 07/24/2015   Anxiety disorder    Arthritis    hips   Asthma    Benign hypertension 12/01/2017   Bursitis of hip 12/01/2017   Chronic hip pain    Closed head injury 05/08/2018   COPD (chronic obstructive pulmonary disease) (HCC)    Diabetes mellitus (HCC) 12/01/2017  Diabetes mellitus without complication (HCC)    Essential (primary) hypertension    Hyperlipidemia    Hypertension    Hypertriglyceridemia 12/01/2017   Myocardial infarction [facility])    Polycythemia, secondary 06/03/2021   Spinal stenosis 12/01/2017   Type 2 diabetes mellitus without complications (HCC)    Vitamin D deficiency    Xerosis cutis    Allergies  Allergen Reactions   Metoprolol Anaphylaxis   Aspirin Other (See Comments)    Bleeding ulcers   Nsaids Other (See Comments)    Bleeding ulcers      Patient Care Team: Cherrie Franca, Ouida Sills as PCP - General  (Physician Assistant) Weston Settle, MD as Consulting Physician (Oncology)   Outpatient Medications Prior to Visit  Medication Sig   albuterol (VENTOLIN HFA) 108 (90 Base) MCG/ACT inhaler Inhale 2 puffs into the lungs every 6 (six) hours as needed for wheezing or shortness of breath.   amitriptyline (ELAVIL) 25 MG tablet Take 1 tablet (25 mg total) by mouth at bedtime.   amLODipine (NORVASC) 10 MG tablet Take 1 tablet (10 mg total) by mouth daily.   atorvastatin (LIPITOR) 20 MG tablet Take 1 tablet (20 mg total) by mouth daily.   ipratropium-albuterol (DUONEB) 0.5-2.5 (3) MG/3ML SOLN Take 3 mLs by nebulization every 6 (six) hours as needed (wheezing or shortness of breath).   lisinopril-hydrochlorothiazide (ZESTORETIC) 10-12.5 MG tablet Take 1 tablet by mouth daily.   metFORMIN (GLUCOPHAGE) 1000 MG tablet Take 1 tablet (1,000 mg total) by mouth 2 (two) times daily.   naloxone (NARCAN) nasal spray 4 mg/0.1 mL Place 1 spray into the nose once.   ondansetron (ZOFRAN) 4 MG tablet Take 1 tablet (4 mg total) by mouth every 8 (eight) hours as needed for nausea or vomiting.   oxyCODONE-acetaminophen (PERCOCET) 7.5-325 MG tablet Take 1 tablet by mouth 4 (four) times daily.   pantoprazole (PROTONIX) 40 MG tablet Take 1 tablet (40 mg total) by mouth 2 (two) times daily.   polyethylene glycol-electrolytes (NULYTELY) 420 g solution Take 4,000 mLs by mouth once.   umeclidinium bromide (INCRUSE ELLIPTA) 62.5 MCG/ACT AEPB Inhale 1 puff into the lungs daily.   [DISCONTINUED] sertraline (ZOLOFT) 50 MG tablet Take 1 tablet (50 mg total) by mouth daily.   No facility-administered medications prior to visit.    ROS        Objective:     BP 114/71 (BP Location: Right Arm, Patient Position: Sitting, Cuff Size: Normal)   Pulse 78   Temp 98 F (36.7 C) (Oral)   Ht 5\' 4"  (1.626 m)   Wt 135 lb 14.4 oz (61.6 kg)   SpO2 92%   BMI 23.33 kg/m  BP Readings from Last 3 Encounters:  10/10/23 114/71   10/03/23 (!) 148/88  08/04/23 131/79   Wt Readings from Last 3 Encounters:  10/10/23 135 lb 14.4 oz (61.6 kg)  10/03/23 135 lb (61.2 kg)  09/10/23 135 lb (61.2 kg)      Physical Exam Constitutional:      General: She is not in acute distress.    Appearance: Normal appearance. She is not ill-appearing or diaphoretic.  HENT:     Head: Normocephalic and atraumatic.     Right Ear: Tympanic membrane, ear canal and external ear normal.     Left Ear: Tympanic membrane, ear canal and external ear normal.     Nose: Nose normal.     Mouth/Throat:     Mouth: Mucous membranes are moist.  Pharynx: Oropharynx is clear.  Eyes:     General: No scleral icterus.       Right eye: No discharge.        Left eye: No discharge.     Extraocular Movements: Extraocular movements intact.     Conjunctiva/sclera: Conjunctivae normal.     Pupils: Pupils are equal, round, and reactive to light.  Neck:     Thyroid: No thyroid mass, thyromegaly or thyroid tenderness.  Cardiovascular:     Rate and Rhythm: Normal rate and regular rhythm.     Pulses: Normal pulses.          Dorsalis pedis pulses are 2+ on the right side and 2+ on the left side.       Posterior tibial pulses are 2+ on the right side and 2+ on the left side.     Heart sounds: Normal heart sounds. No murmur heard.    No friction rub. No gallop.  Pulmonary:     Effort: Pulmonary effort is normal.     Breath sounds: Normal breath sounds. No wheezing, rhonchi or rales.  Chest:     Chest wall: No tenderness.  Abdominal:     General: Bowel sounds are normal. There is no distension.     Palpations: Abdomen is soft.     Tenderness: There is no abdominal tenderness. There is no guarding.  Musculoskeletal:        General: No swelling, deformity or signs of injury.     Right lower leg: No edema.     Left lower leg: No edema.     Right foot: No deformity or Charcot foot.     Left foot: No deformity or Charcot foot.  Feet:     Right foot:      Protective Sensation: 8 sites tested.  8 sites sensed.     Skin integrity: Skin integrity normal. No ulcer, skin breakdown, erythema or warmth.     Toenail Condition: Right toenails are normal.     Left foot:     Protective Sensation: 8 sites tested.  8 sites sensed.     Skin integrity: Skin integrity normal. No ulcer, skin breakdown, erythema or warmth.     Toenail Condition: Left toenails are normal.  Lymphadenopathy:     Cervical:     Right cervical: No superficial or posterior cervical adenopathy.    Left cervical: No superficial cervical adenopathy.  Skin:    Coloration: Skin is not jaundiced.     Findings: No rash.  Neurological:     General: No focal deficit present.     Mental Status: She is alert and oriented to person, place, and time.     Motor: No weakness.  Psychiatric:        Behavior: Behavior normal.      No results found for any visits on 10/10/23. Last CBC Lab Results  Component Value Date   WBC 8.2 08/22/2023   HGB 15.8 08/22/2023   HCT 46.4 08/22/2023   MCV 91 08/22/2023   MCH 30.9 08/22/2023   RDW 13.1 08/22/2023   PLT 239 08/22/2023   Last metabolic panel Lab Results  Component Value Date   GLUCOSE 121 (H) 08/05/2023   NA 139 08/05/2023   K 4.0 08/05/2023   CL 99 08/05/2023   CO2 22 08/05/2023   BUN 12 08/05/2023   CREATININE 0.91 08/05/2023   EGFR 70 08/05/2023   CALCIUM 10.1 08/05/2023   PROT 6.8 08/05/2023   ALBUMIN 4.5 08/05/2023  LABGLOB 2.3 08/05/2023   BILITOT 0.4 08/05/2023   ALKPHOS 64 08/05/2023   AST 9 08/05/2023   ALT 9 08/05/2023   ANIONGAP 17 (H) 10/19/2015   Last lipids Lab Results  Component Value Date   CHOL 143 09/02/2023   HDL 64 09/02/2023   LDLCALC 63 09/02/2023   TRIG 83 09/02/2023   CHOLHDL 2.2 09/02/2023   Last hemoglobin A1c Lab Results  Component Value Date   HGBA1C 5.4 09/02/2023        Assessment & Plan:    Routine Health Maintenance and Physical Exam  Health Maintenance  Topic Date Due    Pneumococcal Vaccination (1 of 2 - PCV) Never done   HIV Screening  Never done   Hepatitis C Screening  Never done   Pap with HPV screening  Never done   Zoster (Shingles) Vaccine (2 of 2) 01/15/2018   COVID-19 Vaccine (4 - 2024-25 season) 08/05/2023   Eye exam for diabetics  12/17/2023*   Hemoglobin A1C  03/01/2024   Yearly kidney function blood test for diabetes  08/04/2024   Yearly kidney health urinalysis for diabetes  09/01/2024   Complete foot exam   10/09/2024   Mammogram  12/03/2024   Colon Cancer Screening  10/02/2026   DTaP/Tdap/Td vaccine (2 - Td or Tdap) 11/21/2027   Flu Shot  Completed   HPV Vaccine  Aged Out  *Topic was postponed. The date shown is not the original due date.   She should continue to engage in regular physical activity as tolerated.   Adult general medical exam  Anxiety Assessment & Plan: Increase Lexapro to 100 mg daily, still not well-controlled. May add on BuSpar at next visit if still uncontrolled.  Orders: -     Sertraline HCl; Take 1 tablet (100 mg total) by mouth daily.  Dispense: 30 tablet; Refill: 5  Tobacco use disorder Assessment & Plan: Order Chantix. No history of seizure disorder or seizure activity. Provided with Chantix information.  Orders: -     Varenicline Tartrate (Starter); Take as directed on the box.  Dispense: 1 each; Refill: 0  Chronic obstructive pulmonary disease, unspecified COPD type (HCC) Assessment & Plan: May reperform PFTs at next visit.  Still awaiting results. Continue Incruse as maintenance and Ventolin HFA as rescue inhaler.     Return in about 6 weeks (around 11/21/2023) for anxiety dose change.     Teryl Lucy Zhara Gieske, PA-C

## 2023-10-10 NOTE — Assessment & Plan Note (Signed)
 Increase Lexapro to 100 mg daily, still not well-controlled. May add on BuSpar at next visit if still uncontrolled.

## 2023-10-19 ENCOUNTER — Other Ambulatory Visit (HOSPITAL_BASED_OUTPATIENT_CLINIC_OR_DEPARTMENT_OTHER): Payer: Self-pay | Admitting: Student

## 2023-10-19 DIAGNOSIS — J449 Chronic obstructive pulmonary disease, unspecified: Secondary | ICD-10-CM

## 2023-10-21 ENCOUNTER — Other Ambulatory Visit (HOSPITAL_BASED_OUTPATIENT_CLINIC_OR_DEPARTMENT_OTHER): Payer: Self-pay | Admitting: Student

## 2023-10-21 DIAGNOSIS — J449 Chronic obstructive pulmonary disease, unspecified: Secondary | ICD-10-CM

## 2023-10-21 MED ORDER — ALBUTEROL SULFATE HFA 108 (90 BASE) MCG/ACT IN AERS: 2.0000 | INHALATION_SPRAY | Freq: Four times a day (QID) | RESPIRATORY_TRACT | 0 refills | Status: DC | PRN

## 2023-10-30 ENCOUNTER — Other Ambulatory Visit (HOSPITAL_BASED_OUTPATIENT_CLINIC_OR_DEPARTMENT_OTHER): Payer: Self-pay | Admitting: Student

## 2023-10-30 DIAGNOSIS — R112 Nausea with vomiting, unspecified: Secondary | ICD-10-CM

## 2023-10-31 ENCOUNTER — Other Ambulatory Visit (HOSPITAL_BASED_OUTPATIENT_CLINIC_OR_DEPARTMENT_OTHER): Payer: Self-pay | Admitting: Student

## 2023-11-01 ENCOUNTER — Other Ambulatory Visit (HOSPITAL_BASED_OUTPATIENT_CLINIC_OR_DEPARTMENT_OTHER): Payer: Self-pay | Admitting: Student

## 2023-11-01 DIAGNOSIS — F419 Anxiety disorder, unspecified: Secondary | ICD-10-CM

## 2023-11-18 ENCOUNTER — Emergency Department (HOSPITAL_COMMUNITY)

## 2023-11-18 ENCOUNTER — Other Ambulatory Visit: Payer: Self-pay

## 2023-11-18 ENCOUNTER — Emergency Department (HOSPITAL_COMMUNITY)
Admission: EM | Admit: 2023-11-18 | Discharge: 2023-11-18 | Disposition: A | Discharge status: Home / Self Care | Attending: Emergency Medicine | Admitting: Emergency Medicine

## 2023-11-18 ENCOUNTER — Encounter (HOSPITAL_COMMUNITY): Payer: Self-pay

## 2023-11-18 DIAGNOSIS — R109 Unspecified abdominal pain: Secondary | ICD-10-CM | POA: Insufficient documentation

## 2023-11-18 DIAGNOSIS — Z79899 Other long term (current) drug therapy: Secondary | ICD-10-CM | POA: Diagnosis not present

## 2023-11-18 DIAGNOSIS — R110 Nausea: Secondary | ICD-10-CM

## 2023-11-18 DIAGNOSIS — D72829 Elevated white blood cell count, unspecified: Secondary | ICD-10-CM | POA: Diagnosis not present

## 2023-11-18 DIAGNOSIS — E119 Type 2 diabetes mellitus without complications: Secondary | ICD-10-CM | POA: Diagnosis not present

## 2023-11-18 DIAGNOSIS — E876 Hypokalemia: Secondary | ICD-10-CM | POA: Insufficient documentation

## 2023-11-18 DIAGNOSIS — R630 Anorexia: Secondary | ICD-10-CM | POA: Insufficient documentation

## 2023-11-18 LAB — CBC WITH DIFFERENTIAL/PLATELET
Abs Immature Granulocytes: 0.1 10*3/uL — ABNORMAL HIGH (ref 0.00–0.07)
Basophils Absolute: 0 10*3/uL (ref 0.0–0.1)
Basophils Relative: 0 %
Eosinophils Absolute: 0 10*3/uL (ref 0.0–0.5)
Eosinophils Relative: 0 %
HCT: 49.9 % — ABNORMAL HIGH (ref 36.0–46.0)
Hemoglobin: 16.1 g/dL — ABNORMAL HIGH (ref 12.0–15.0)
Immature Granulocytes: 1 %
Lymphocytes Relative: 8 %
Lymphs Abs: 1.4 10*3/uL (ref 0.7–4.0)
MCH: 29.2 pg (ref 26.0–34.0)
MCHC: 32.3 g/dL (ref 30.0–36.0)
MCV: 90.6 fL (ref 80.0–100.0)
Monocytes Absolute: 1.5 10*3/uL — ABNORMAL HIGH (ref 0.1–1.0)
Monocytes Relative: 9 %
Neutro Abs: 14 10*3/uL — ABNORMAL HIGH (ref 1.7–7.7)
Neutrophils Relative %: 82 %
Platelets: 233 10*3/uL (ref 150–400)
RBC: 5.51 MIL/uL — ABNORMAL HIGH (ref 3.87–5.11)
RDW: 13.8 % (ref 11.5–15.5)
WBC: 17 10*3/uL — ABNORMAL HIGH (ref 4.0–10.5)
nRBC: 0 % (ref 0.0–0.2)

## 2023-11-18 LAB — COMPREHENSIVE METABOLIC PANEL WITH GFR
ALT: 13 U/L (ref 0–44)
AST: 18 U/L (ref 15–41)
Albumin: 4.3 g/dL (ref 3.5–5.0)
Alkaline Phosphatase: 47 U/L (ref 38–126)
Anion gap: 17 — ABNORMAL HIGH (ref 5–15)
BUN: 24 mg/dL — ABNORMAL HIGH (ref 8–23)
CO2: 19 mmol/L — ABNORMAL LOW (ref 22–32)
Calcium: 9.8 mg/dL (ref 8.9–10.3)
Chloride: 99 mmol/L (ref 98–111)
Creatinine, Ser: 0.94 mg/dL (ref 0.44–1.00)
GFR, Estimated: >60 mL/min (ref >60)
Glucose, Bld: 145 mg/dL — ABNORMAL HIGH (ref 70–99)
Potassium: 2.9 mmol/L — ABNORMAL LOW (ref 3.5–5.1)
Sodium: 135 mmol/L (ref 135–145)
Total Bilirubin: 1.3 mg/dL — ABNORMAL HIGH (ref 0.0–1.2)
Total Protein: 7.9 g/dL (ref 6.5–8.1)

## 2023-11-18 LAB — URINALYSIS, ROUTINE W REFLEX MICROSCOPIC
Bilirubin Urine: NEGATIVE
Glucose, UA: NEGATIVE mg/dL
Ketones, ur: 20 mg/dL — AB
Leukocytes,Ua: NEGATIVE
Nitrite: NEGATIVE
Protein, ur: 100 mg/dL — AB
Specific Gravity, Urine: 1.018 (ref 1.005–1.030)
pH: 6 (ref 5.0–8.0)

## 2023-11-18 MED ORDER — ONDANSETRON HCL 4 MG/2ML IJ SOLN
4.0000 mg | Freq: Once | INTRAMUSCULAR | Status: AC
  Administered 2023-11-18: 4 mg via INTRAVENOUS
  Filled 2023-11-18: qty 2

## 2023-11-18 MED ORDER — KETOROLAC TROMETHAMINE 15 MG/ML IJ SOLN
15.0000 mg | Freq: Once | INTRAMUSCULAR | Status: AC
  Administered 2023-11-18: 15 mg via INTRAVENOUS
  Filled 2023-11-18: qty 1

## 2023-11-18 MED ORDER — POTASSIUM CHLORIDE CRYS ER 20 MEQ PO TBCR: 20.0000 meq | EXTENDED_RELEASE_TABLET | Freq: Two times a day (BID) | ORAL | 0 refills | Status: DC

## 2023-11-18 MED ORDER — POTASSIUM CHLORIDE 20 MEQ PO PACK
40.0000 meq | PACK | Freq: Once | ORAL | Status: AC
  Administered 2023-11-18: 40 meq via ORAL
  Filled 2023-11-18: qty 2

## 2023-11-18 MED ORDER — ONDANSETRON 4 MG PO TBDP: 4.0000 mg | ORAL_TABLET | Freq: Three times a day (TID) | ORAL | 0 refills | Status: DC | PRN

## 2023-11-18 NOTE — Discharge Instructions (Addendum)
 Your CT scan and your blood work is overall reassuring except for you have an elevated white count.  This may be from your vomiting.  Please take Tylenol, ibuprofen for your pain control, as well as supplement with the potassium.  Have sent you some potassium to the pharmacy, as well as nausea medicine.  Please follow-up with your primary care doctor for further evaluation.  The cause of your pain at this time is unclear.  Please follow-up with your PCP in regards to your low potassium.

## 2023-11-18 NOTE — ED Notes (Addendum)
 Patient aware of the need of urine sample-does not have the urge to void at this time. Call light within reach.

## 2023-11-18 NOTE — ED Provider Notes (Signed)
 [facility] AT [facility] Provider Note   CSN: [id number] Arrival date & time: 11/18/23  0840     History  Chief Complaint  Patient presents with   Flank Pain    Non-radiating right flank pain with N/V, loss of appetite onset yesterday.     Victoria Rose is a 63 y.o. female, history of type 2 diabetes, polycythemia, who presents to the ED secondary to right flank pain, this been going on for the last 2 days.  She she states for the last 2 days, she has had an aching, in her right back, that has been intractable.  She states she has associated nausea, vomiting, and copious amounts of dry heaving.  She denies any urinary symptoms, including dysuria, urinary frequency, urgency.  Does state that she went to urgent care yesterday and was told that she had blood in her urine however.  She notes that she is not having any kind of vaginal discharge.  Denies any chest pain or shortness of breath.    Home Medications Prior to Admission medications   Medication Sig Start Date End Date Taking? Authorizing Provider  ondansetron (ZOFRAN-ODT) 4 MG disintegrating tablet Take 1 tablet (4 mg total) by mouth every 8 (eight) hours as needed for nausea or vomiting. 11/18/23  Yes Lakshya Mcgillicuddy L, PA  potassium chloride SA (KLOR-CON M) 20 MEQ tablet Take 1 tablet (20 mEq total) by mouth 2 (two) times daily. 11/18/23  Yes Marbella Markgraf L, PA  albuterol (VENTOLIN HFA) 108 (90 Base) MCG/ACT inhaler Inhale 2 puffs into the lungs every 6 (six) hours as needed for wheezing or shortness of breath. 10/21/23   Rothfuss, Gerilyn Pilgrim T, PA-C  amitriptyline (ELAVIL) 25 MG tablet TAKE 1 TABLET BY MOUTH EVERYDAY AT BEDTIME 10/31/23   Rothfuss, Jacob T, PA-C  amLODipine (NORVASC) 10 MG tablet Take 1 tablet (10 mg total) by mouth daily. 10/08/23   Rothfuss, Gerilyn Pilgrim T, PA-C  atorvastatin (LIPITOR) 20 MG tablet Take 1 tablet (20 mg total) by mouth daily. 10/08/23   Rothfuss, Teryl Lucy, PA-C  INCRUSE ELLIPTA 62.5  MCG/ACT AEPB TAKE 1 PUFF BY MOUTH EVERY DAY 10/19/23   Rothfuss, Gerilyn Pilgrim T, PA-C  ipratropium-albuterol (DUONEB) 0.5-2.5 (3) MG/3ML SOLN Take 3 mLs by nebulization every 6 (six) hours as needed (wheezing or shortness of breath). 07/22/22   [provider]  lisinopril-hydrochlorothiazide (ZESTORETIC) 10-12.5 MG tablet Take 1 tablet by mouth daily. 10/08/23   Rothfuss, Teryl Lucy, PA-C  metFORMIN (GLUCOPHAGE) 1000 MG tablet Take 1 tablet (1,000 mg total) by mouth 2 (two) times daily. 10/08/23   Rothfuss, Gerilyn Pilgrim T, PA-C  naloxone [facility]) nasal spray 4 mg/0.1 mL Place 1 spray into the nose once.    [provider]  oxyCODONE-acetaminophen (PERCOCET) 7.5-325 MG tablet Take 1 tablet by mouth 4 (four) times daily. 07/27/23   [provider]  pantoprazole (PROTONIX) 40 MG tablet TAKE 1 TABLET BY MOUTH TWICE A DAY 10/30/23   Rothfuss, Gerilyn Pilgrim T, PA-C  polyethylene glycol-electrolytes (NULYTELY) 420 g solution Take 4,000 mLs by mouth once. 09/12/23   [provider]  sertraline (ZOLOFT) 100 MG tablet TAKE 1 TABLET BY MOUTH EVERY DAY 11/03/23   Rothfuss, Gerilyn Pilgrim T, PA-C  Varenicline Tartrate, Starter, (CHANTIX STARTING MONTH PAK) 0.5 MG X 11 & 1 MG X 42 TBPK Take as directed on the box. 10/10/23   Rothfuss, Gerilyn Pilgrim T, PA-C      Allergies    Metoprolol, Aspirin, and Nsaids  Review of Systems   Review of Systems  Constitutional:  Negative for fever.  Genitourinary:  Positive for flank pain.    Physical Exam Updated Vital Signs BP (!) 158/89 (BP Location: Left Arm)   Pulse 88   Temp 98 F (36.7 C) (Oral)   Resp 20   Ht 5\' 4"  (1.626 m)   Wt 59 kg   SpO2 93%   BMI 22.31 kg/m  Physical Exam Vitals and nursing note reviewed.  Constitutional:      General: She is not in acute distress.    Appearance: She is well-developed.  HENT:     Head: Normocephalic and atraumatic.  Eyes:     Conjunctiva/sclera: Conjunctivae normal.  Cardiovascular:     Rate and Rhythm: Normal rate and  regular rhythm.     Heart sounds: No murmur heard. Pulmonary:     Effort: Pulmonary effort is normal. No respiratory distress.     Breath sounds: Normal breath sounds.  Abdominal:     Palpations: Abdomen is soft.     Tenderness: There is no abdominal tenderness. There is right CVA tenderness. There is no guarding.  Musculoskeletal:        General: No swelling.     Cervical back: Neck supple.  Skin:    General: Skin is warm and dry.     Capillary Refill: Capillary refill takes less than 2 seconds.  Neurological:     Mental Status: She is alert.  Psychiatric:        Mood and Affect: Mood normal.     ED Results / Procedures / Treatments   Labs (all labs ordered are listed, but only abnormal results are displayed) Labs Reviewed  CBC WITH DIFFERENTIAL/PLATELET - Abnormal; Notable for the following components:      Result Value   WBC 17.0 (*)    RBC 5.51 (*)    Hemoglobin 16.1 (*)    HCT 49.9 (*)    Neutro Abs 14.0 (*)    Monocytes Absolute 1.5 (*)    Abs Immature Granulocytes 0.10 (*)    All other components within normal limits  COMPREHENSIVE METABOLIC PANEL WITH GFR - Abnormal; Notable for the following components:   Potassium 2.9 (*)    CO2 19 (*)    Glucose, Bld 145 (*)    BUN 24 (*)    Total Bilirubin 1.3 (*)    Anion gap 17 (*)    All other components within normal limits  URINALYSIS, ROUTINE W REFLEX MICROSCOPIC - Abnormal; Notable for the following components:   Hgb urine dipstick MODERATE (*)    Ketones, ur 20 (*)    Protein, ur 100 (*)    Bacteria, UA RARE (*)    All other components within normal limits    EKG None  Radiology CT Renal Stone Study Result Date: 11/18/2023 CLINICAL DATA:  Abdominal/flank pain, stone suspected. Right-sided back pain. EXAM: CT ABDOMEN AND PELVIS WITHOUT CONTRAST TECHNIQUE: Multidetector CT imaging of the abdomen and pelvis was performed following the standard protocol without IV contrast. RADIATION DOSE REDUCTION: This exam was  performed according to the departmental dose-optimization program which includes automated exposure control, adjustment of the mA and/or kV according to patient size and/or use of iterative reconstruction technique. COMPARISON:  CT scan abdomen and pelvis from 08/08/2023. FINDINGS: Lower chest: The lung bases are clear. No pleural effusion. The heart is normal in size. No pericardial effusion. Hepatobiliary: The liver is normal in size. Non-cirrhotic configuration. No suspicious mass. There is  a persistent, geographic, Ivon Oelkers, hypoattenuating area along the falciform ligament attachment site, which even though incompletely characterized on the current examination, favored to represent focal fatty infiltration. No intrahepatic or extrahepatic bile duct dilation. No calcified gallstones. Normal gallbladder wall thickness. No pericholecystic inflammatory changes. Pancreas: Unremarkable. No pancreatic ductal dilatation or surrounding inflammatory changes. Spleen: Within normal limits. No focal lesion. Adrenals/Urinary Tract: Adrenal glands are unremarkable. No suspicious renal mass within the limitations of this unenhanced exam. No nephroureterolithiasis or obstructive uropathy. There is mild bilateral perinephric fat stranding, which is nonspecific and grossly similar to the prior study. Urinary bladder is under distended, precluding optimal assessment. However, no large mass or stones identified. No perivesical fat stranding. Stomach/Bowel: No disproportionate dilation of the Shivam Mestas or large bowel loops. No evidence of abnormal bowel wall thickening or inflammatory changes. The appendix is unremarkable. There are multiple diverticula mainly in the sigmoid colon, without imaging signs of diverticulitis. Vascular/Lymphatic: No ascites or pneumoperitoneum. No abdominal or pelvic lymphadenopathy, by size criteria. No aneurysmal dilation of the major abdominal arteries. There are mild peripheral atherosclerotic vascular  calcifications of the aorta and its major branches. Reproductive: The uterus is unremarkable. No large adnexal mass. Other: Bilateral tiny fat containing femoral hernias noted. The soft tissues and abdominal wall are otherwise unremarkable. Musculoskeletal: No suspicious osseous lesions. There are mild multilevel degenerative changes in the visualized spine. IMPRESSION: 1. No nephroureterolithiasis or obstructive uropathy. No acute inflammatory process identified within the abdomen or pelvis. 2. Multiple other nonacute observations, as described above. Aortic Atherosclerosis (ICD10-I70.0). Electronically Signed   By: Jules Schick M.D.   On: 11/18/2023 11:37    Procedures Procedures    Medications Ordered in ED Medications  ondansetron (ZOFRAN) injection 4 mg (4 mg Intravenous Given 11/18/23 0909)  ketorolac (TORADOL) 15 MG/ML injection 15 mg (15 mg Intravenous Given 11/18/23 0909)  potassium chloride (KLOR-CON) packet 40 mEq (40 mEq Oral Given 11/18/23 1113)    ED Course/ Medical Decision Making/ A&P                                 Medical Decision Making Patient is a 63 year old female, here for right flank pain, with nausea, and dry heaving this been going on for the last few days.  She denies any shortness of breath, abdominal pain, chest pain, states that the pain is worse when she moves.  Also reports some hematuria.  She is overall well-appearing on exam, she is having pain, when she moves.  Will give her Toradol, Zofran, for relief.  CT renal ordered, due to suspicion of possible renal stone  Amount and/or Complexity of Data Reviewed Labs: ordered.    Details: Leukocytosis of 17K, moderate blood in the urine, potassium of 2.9 Radiology: ordered.    Details: CT renal shows no acute process, no evidence of any kind of renal stone, or acute inflammatory processes, in the abdomen or pelvis Discussion of management or test interpretation with external provider(s): Discussed with patient, her  blood work is overall reassuring except for a leukocytosis of 17K, which may be reactive from the nausea, vomiting.  It is unclear at this time, CT renal shows no clear source, for her pain.  I offered her a CT scan with contrast, for further visualization, and she declined.  She has a follow-up appointment scheduled with her primary care doctor, in 2 days, and she states she will come back if things worsen.  Her  urinalysis is unremarkable, and she is feeling much better after the Toradol.  Given that her pain is worse, with movement, it is possible that this may secondary to muscle strain, and GI bug combo.  Or that the pain is causing her bad nausea from a bad muscle strain.  I instructed her on return precautions and she voiced understanding.  Discharged home  Risk Prescription drug management.   Final Clinical Impression(s) / ED Diagnoses Final diagnoses:  Right flank pain  Nausea    Rx / DC Orders ED Discharge Orders          Ordered    ondansetron (ZOFRAN-ODT) 4 MG disintegrating tablet  Every 8 hours PRN        11/18/23 1207    potassium chloride SA (KLOR-CON M) 20 MEQ tablet  2 times daily        11/18/23 1207              Cameshia Cressman, [place], [place] 11/18/23 1211    Arby Barrette, MD 11/19/23 [phone]

## 2023-11-20 ENCOUNTER — Ambulatory Visit (HOSPITAL_BASED_OUTPATIENT_CLINIC_OR_DEPARTMENT_OTHER): Payer: Medicaid Other | Admitting: Student

## 2023-11-20 ENCOUNTER — Other Ambulatory Visit: Payer: Self-pay

## 2023-11-20 ENCOUNTER — Emergency Department (HOSPITAL_COMMUNITY): Admission: EM | Admit: 2023-11-20 | Discharge: 2023-11-20 | Disposition: A | Discharge status: Home / Self Care

## 2023-11-20 ENCOUNTER — Encounter (HOSPITAL_BASED_OUTPATIENT_CLINIC_OR_DEPARTMENT_OTHER): Payer: Self-pay | Admitting: Student

## 2023-11-20 ENCOUNTER — Emergency Department (HOSPITAL_COMMUNITY)

## 2023-11-20 VITALS — BP 110/81 | HR 105 | Temp 98.0°F | Resp 16 | Ht 64.0 in | Wt 123.7 lb

## 2023-11-20 DIAGNOSIS — D72829 Elevated white blood cell count, unspecified: Secondary | ICD-10-CM | POA: Diagnosis not present

## 2023-11-20 DIAGNOSIS — J449 Chronic obstructive pulmonary disease, unspecified: Secondary | ICD-10-CM | POA: Insufficient documentation

## 2023-11-20 DIAGNOSIS — R718 Other abnormality of red blood cells: Secondary | ICD-10-CM | POA: Insufficient documentation

## 2023-11-20 DIAGNOSIS — R000 Tachycardia, unspecified: Secondary | ICD-10-CM | POA: Diagnosis not present

## 2023-11-20 DIAGNOSIS — M545 Low back pain, unspecified: Secondary | ICD-10-CM | POA: Insufficient documentation

## 2023-11-20 DIAGNOSIS — I10 Essential (primary) hypertension: Secondary | ICD-10-CM | POA: Insufficient documentation

## 2023-11-20 DIAGNOSIS — B349 Viral infection, unspecified: Secondary | ICD-10-CM | POA: Insufficient documentation

## 2023-11-20 DIAGNOSIS — E119 Type 2 diabetes mellitus without complications: Secondary | ICD-10-CM | POA: Diagnosis not present

## 2023-11-20 DIAGNOSIS — Z79899 Other long term (current) drug therapy: Secondary | ICD-10-CM | POA: Insufficient documentation

## 2023-11-20 DIAGNOSIS — R112 Nausea with vomiting, unspecified: Secondary | ICD-10-CM | POA: Diagnosis present

## 2023-11-20 LAB — URINALYSIS, MICROSCOPIC (REFLEX)

## 2023-11-20 LAB — URINALYSIS, ROUTINE W REFLEX MICROSCOPIC
Bilirubin Urine: NEGATIVE
Glucose, UA: NEGATIVE mg/dL
Ketones, ur: NEGATIVE mg/dL
Leukocytes,Ua: NEGATIVE
Nitrite: NEGATIVE
Protein, ur: NEGATIVE mg/dL
Specific Gravity, Urine: 1.01 (ref 1.005–1.030)
pH: 6 (ref 5.0–8.0)

## 2023-11-20 LAB — COMPREHENSIVE METABOLIC PANEL WITH GFR
ALT: 19 U/L (ref 0–44)
AST: 29 U/L (ref 15–41)
Albumin: 4 g/dL (ref 3.5–5.0)
Alkaline Phosphatase: 44 U/L (ref 38–126)
Anion gap: 16 — ABNORMAL HIGH (ref 5–15)
BUN: 37 mg/dL — ABNORMAL HIGH (ref 8–23)
CO2: 17 mmol/L — ABNORMAL LOW (ref 22–32)
Calcium: 10.4 mg/dL — ABNORMAL HIGH (ref 8.9–10.3)
Chloride: 99 mmol/L (ref 98–111)
Creatinine, Ser: 1.26 mg/dL — ABNORMAL HIGH (ref 0.44–1.00)
GFR, Estimated: 48 mL/min — ABNORMAL LOW (ref >60)
Glucose, Bld: 133 mg/dL — ABNORMAL HIGH (ref 70–99)
Potassium: 4.2 mmol/L (ref 3.5–5.1)
Sodium: 132 mmol/L — ABNORMAL LOW (ref 135–145)
Total Bilirubin: 1.4 mg/dL — ABNORMAL HIGH (ref 0.0–1.2)
Total Protein: 7.4 g/dL (ref 6.5–8.1)

## 2023-11-20 LAB — LIPASE, BLOOD: Lipase: 25 U/L (ref 11–51)

## 2023-11-20 LAB — CBC
HCT: 53.2 % — ABNORMAL HIGH (ref 36.0–46.0)
Hemoglobin: 17.6 g/dL — ABNORMAL HIGH (ref 12.0–15.0)
MCH: 29.5 pg (ref 26.0–34.0)
MCHC: 33.1 g/dL (ref 30.0–36.0)
MCV: 89.3 fL (ref 80.0–100.0)
Platelets: 258 10*3/uL (ref 150–400)
RBC: 5.96 MIL/uL — ABNORMAL HIGH (ref 3.87–5.11)
RDW: 13.6 % (ref 11.5–15.5)
WBC: 12.5 10*3/uL — ABNORMAL HIGH (ref 4.0–10.5)
nRBC: 0 % (ref 0.0–0.2)

## 2023-11-20 LAB — POCT GLUCOSE (DEVICE FOR HOME USE)
Glucose Fasting, POC: 156 mg/dL (ref 70–99)
POC Glucose: 156 mg/dL — AB (ref 70–99)

## 2023-11-20 LAB — RESP PANEL BY RT-PCR (RSV, FLU A&B, COVID)  RVPGX2
Influenza A by PCR: NEGATIVE
Influenza B by PCR: NEGATIVE
Resp Syncytial Virus by PCR: NEGATIVE
SARS Coronavirus 2 by RT PCR: NEGATIVE

## 2023-11-20 LAB — MAGNESIUM: Magnesium: 1.6 mg/dL — ABNORMAL LOW (ref 1.7–2.4)

## 2023-11-20 MED ORDER — SODIUM CHLORIDE 0.9 % IV BOLUS
1000.0000 mL | Freq: Once | INTRAVENOUS | Status: AC
  Administered 2023-11-20: 1000 mL via INTRAVENOUS

## 2023-11-20 MED ORDER — MAGNESIUM SULFATE 2 GM/50ML IV SOLN
2.0000 g | Freq: Once | INTRAVENOUS | Status: AC
  Administered 2023-11-20: 2 g via INTRAVENOUS
  Filled 2023-11-20: qty 50

## 2023-11-20 MED ORDER — PANTOPRAZOLE SODIUM 40 MG IV SOLR
40.0000 mg | Freq: Once | INTRAVENOUS | Status: AC
  Administered 2023-11-20: 40 mg via INTRAVENOUS
  Filled 2023-11-20: qty 10

## 2023-11-20 MED ORDER — IOHEXOL 350 MG/ML SOLN
60.0000 mL | Freq: Once | INTRAVENOUS | Status: AC | PRN
  Administered 2023-11-20: 60 mL via INTRAVENOUS

## 2023-11-20 MED ORDER — ONDANSETRON HCL 4 MG/2ML IJ SOLN
4.0000 mg | Freq: Once | INTRAMUSCULAR | Status: AC
  Administered 2023-11-20: 4 mg via INTRAVENOUS
  Filled 2023-11-20: qty 2

## 2023-11-20 MED ORDER — ONDANSETRON HCL 4 MG/2ML IJ SOLN
4.0000 mg | Freq: Once | INTRAMUSCULAR | Status: AC
  Administered 2023-11-20: 4 mg via INTRAVENOUS

## 2023-11-20 NOTE — ED Triage Notes (Signed)
 Patient seen twice this week for n/v and back pain. Went to PCP for follow up from yesterday's ED visit and he sent her here. Pt states prescribed zofran is not working. States intermittent n/v x 1 month.

## 2023-11-20 NOTE — Progress Notes (Signed)
 Acute Office Visit  Subjective:     Patient ID: Victoria Rose, female    DOB: [birth year], 63 y.o.   MRN: [id number]  Chief Complaint  Patient presents with   Medical Management of Chronic Issues    Here for follow up. Went to [facility] and they said WBC were off. Keeps losing weight. Has not ate in 3 days. Keeps throwing up. Thew up on the way here. Had cervical cancer at 15, had a burn and scrapping. Had some cortisone shot at [facility] and did not even raise sugar. Highest BS has been is 136. Has not checked it today. Is having night sweats.     HPI  Victoria Rose is a 63 year old female who presents with vomiting and high blood sugar.  She has been experiencing vomiting for the past three days, with symptoms worsening over the weekend. She is unable to keep food down, including beef broth consumed this morning. Despite taking her medication at 4:00 AM, her blood sugar remains high. She has not eaten in three days and feels 'really, really bad,' expressing concern that 'something is wrong.'  She has a history of colonoscopy performed in [place], where polyps were found and removed, with no subsequent concerns. There has been no recent colonoscopy since then. No recent nausea is reported, but Zofran, which previously helped with her symptoms, is no longer effective.  Reviewed Prior ER note.  ROS  Per HPI    Objective:    BP 110/81   Pulse (!) 105   Temp 98 F (36.7 C) (Oral)   Resp 16   Ht 5\' 4"  (1.626 m)   Wt 123 lb 11.2 oz (56.1 kg)   SpO2 97%   BMI 21.23 kg/m    Physical Exam Constitutional:      General: She is in acute distress.     Appearance: She is ill-appearing.     Comments: Shaking during entirety of examination. Appears sullen on examination.  HENT:     Right Ear: External ear normal.     Left Ear: External ear normal.  Cardiovascular:     Rate and Rhythm: Regular rhythm. Tachycardia present.     Heart sounds: No murmur heard.    No friction rub.   Pulmonary:     Breath sounds: Rhonchi present.  Neurological:     Mental Status: She is alert.  Psychiatric:     Comments: Tearful during exam     Results for orders placed or performed in visit on 11/20/23  POCT Glucose (Device for Home Use)  Result Value Ref Range   Glucose Fasting, POC 156 70 - 99 mg/dL   POC Glucose 366 (A) 70 - 99 mg/dl        Assessment & Plan:   Infection with leukocytosis and tachycardia Presents with symptoms suggestive of an infection, including leukocytosis and tachycardia. Reports persistent vomiting and inability to maintain oral intake for three days, indicating a severe and acute condition. Has had N/V on and off for several months. Was supposed to see GI but Nausea was better at time of GI visit. Concern for possible severe infection given the leukocytosis and tachycardia, meeting at least 2 SIRS criteria. BODY shakes but without fever during exam. Immediate evaluation at the emergency department is advised for further assessment and management. The possibility of an infectious process is considered more likely than malignancy. - Refer to emergency department for immediate evaluation - Send emergent referral to gastroenterology for further evaluation -  Advise hospital admission for further workup and management - Recommend complete blood workup to assess for infections, thyroid issues, and other potential causes - History of COPD- lungs positive for adventitious sounds today. Ill appearing in clinic. - CTs of abdomen lately have been negative.  Nausea and vomiting Reports persistent nausea and vomiting for several days, with ondansetron no longer providing relief. This is contributing to her overall poor condition and inability to maintain oral intake. The severity of symptoms necessitates emergency department evaluation. - Advise emergency department evaluation for management of nausea and vomiting - N/V has been on and off for several months. It  seemed to be improving with less marijuana but she has acutely worsened over the weekend. States she is having night sweats.  Return if symptoms worsen or fail to improve.  Teryl Lucy Ryelee Albee, PA-C

## 2023-11-20 NOTE — Discharge Instructions (Addendum)
 Please follow-up with your primary care provider closely in regards to symptoms and ER visit. Today your labs are reassuring you most likely a viral illness. Please remain hydrated and eat food as tolerated over the next few days.  You may consider a brat diet that includes bananas, rice, applesauce, toast. Please use the Zofran previously prescribed to you. If symptoms change or worsen please return to the ER.

## 2023-11-20 NOTE — Patient Instructions (Signed)
 Please refer to the ED immediately.

## 2023-11-20 NOTE — ED Provider Notes (Signed)
 [facility] AT [facility] Provider Note   CSN: [id number] Arrival date & time: 11/20/23  1118     History  No chief complaint on file.   Victoria Rose is a 63 y.o. female history of diabetes, anxiety, hypertension, chronic hip pain, chronic arthritis, COPD presented for 3 days of nausea vomiting along with right lower back pain.  Patient was seen at primary care provider along with ED earlier this week for similar symptoms.  Patient states that she had a CT scan done the other day in the ED that was unremarkable and ultimately sent home even though her white count was high.  Patient went to her primary care's office and PCP voiced they are concerned about the leukocytosis and referred her back to the emergency department.  Patient was discharged on Zofran however states that since being discharged in the emergency department she has not been to keep anything down.  Patient is most concerned about dehydration.  Patient states that the right low back pain is worse with movement and denies any abdominal pain with it or urinary symptoms or saddle anesthesia or urinary or bowel incontinence.  Patient denies any fevers.  Home Medications Prior to Admission medications   Medication Sig Start Date End Date Taking? Authorizing Provider  ACCU-CHEK GUIDE TEST test strip 1 each by Other route daily. 11/14/23   [provider]  albuterol (VENTOLIN HFA) 108 (90 Base) MCG/ACT inhaler Inhale 2 puffs into the lungs every 6 (six) hours as needed for wheezing or shortness of breath. 10/21/23   Rothfuss, Gerilyn Pilgrim T, PA-C  amitriptyline (ELAVIL) 25 MG tablet TAKE 1 TABLET BY MOUTH EVERYDAY AT BEDTIME 10/31/23   Rothfuss, Jacob T, PA-C  amLODipine (NORVASC) 10 MG tablet Take 1 tablet (10 mg total) by mouth daily. 10/08/23   Rothfuss, Gerilyn Pilgrim T, PA-C  atorvastatin (LIPITOR) 20 MG tablet Take 1 tablet (20 mg total) by mouth daily. 10/08/23   Rothfuss, Gerilyn Pilgrim T, PA-C  cyclobenzaprine  (FLEXERIL) 10 MG tablet Take 10 mg by mouth every 8 (eight) hours as needed. 11/17/23   [provider]  INCRUSE ELLIPTA 62.5 MCG/ACT AEPB TAKE 1 PUFF BY MOUTH EVERY DAY 10/19/23   Rothfuss, Gerilyn Pilgrim T, PA-C  ipratropium-albuterol (DUONEB) 0.5-2.5 (3) MG/3ML SOLN Take 3 mLs by nebulization every 6 (six) hours as needed (wheezing or shortness of breath). 07/22/22   [provider]  lisinopril-hydrochlorothiazide (ZESTORETIC) 10-12.5 MG tablet Take 1 tablet by mouth daily. 10/08/23   Rothfuss, Teryl Lucy, PA-C  metFORMIN (GLUCOPHAGE) 1000 MG tablet Take 1 tablet (1,000 mg total) by mouth 2 (two) times daily. 10/08/23   Rothfuss, Gerilyn Pilgrim T, PA-C  naloxone [facility]) nasal spray 4 mg/0.1 mL Place 1 spray into the nose once.    [provider]  ondansetron (ZOFRAN-ODT) 4 MG disintegrating tablet Take 1 tablet (4 mg total) by mouth every 8 (eight) hours as needed for nausea or vomiting. 11/18/23   Small, Brooke L, PA  oxyCODONE-acetaminophen (PERCOCET) 7.5-325 MG tablet Take 1 tablet by mouth 4 (four) times daily. 07/27/23   [provider]  pantoprazole (PROTONIX) 40 MG tablet TAKE 1 TABLET BY MOUTH TWICE A DAY 10/30/23   Rothfuss, Gerilyn Pilgrim T, PA-C  potassium chloride SA (KLOR-CON M) 20 MEQ tablet Take 1 tablet (20 mEq total) by mouth 2 (two) times daily. 11/18/23   Small, Brooke L, PA  sertraline (ZOLOFT) 100 MG tablet TAKE 1 TABLET BY MOUTH EVERY DAY 11/03/23   Rothfuss, Teryl Lucy, PA-C  Allergies    Metoprolol, Aspirin, and Nsaids    Review of Systems   Review of Systems  Physical Exam Updated Vital Signs BP (!) 145/100 (BP Location: Right Arm)   Pulse (!) 102   Temp 98.1 F (36.7 C)   Resp (!) 22   SpO2 100%  Physical Exam Vitals reviewed.  Constitutional:      General: She is not in acute distress. HENT:     Head: Normocephalic and atraumatic.  Eyes:     Extraocular Movements: Extraocular movements intact.     Conjunctiva/sclera: Conjunctivae normal.     Pupils: Pupils  are equal, round, and reactive to light.  Cardiovascular:     Rate and Rhythm: Regular rhythm. Tachycardia present.     Pulses: Normal pulses.     Heart sounds: Normal heart sounds.     Comments: 2+ bilateral radial/dorsalis pedis pulses with regular rate Pulmonary:     Effort: Pulmonary effort is normal. No respiratory distress.     Breath sounds: Normal breath sounds.  Abdominal:     Palpations: Abdomen is soft.     Tenderness: There is no abdominal tenderness. There is no guarding or rebound.  Musculoskeletal:        General: Normal range of motion.     Cervical back: Normal range of motion and neck supple.     Comments: 5 out of 5 bilateral grip/leg extension strength  Skin:    General: Skin is warm and dry.     Capillary Refill: Capillary refill takes less than 2 seconds.  Neurological:     General: No focal deficit present.     Mental Status: She is alert and oriented to person, place, and time.     Comments: Sensation intact in all 4 limbs  Psychiatric:        Mood and Affect: Mood normal.     ED Results / Procedures / Treatments   Labs (all labs ordered are listed, but only abnormal results are displayed) Labs Reviewed  COMPREHENSIVE METABOLIC PANEL WITH GFR - Abnormal; Notable for the following components:      Result Value   Sodium 132 (*)    CO2 17 (*)    Glucose, Bld 133 (*)    BUN 37 (*)    Creatinine, Ser 1.26 (*)    Calcium 10.4 (*)    Total Bilirubin 1.4 (*)    GFR, Estimated 48 (*)    Anion gap 16 (*)    All other components within normal limits  CBC - Abnormal; Notable for the following components:   WBC 12.5 (*)    RBC 5.96 (*)    Hemoglobin 17.6 (*)    HCT 53.2 (*)    All other components within normal limits  URINALYSIS, ROUTINE W REFLEX MICROSCOPIC - Abnormal; Notable for the following components:   Hgb urine dipstick TRACE (*)    All other components within normal limits  MAGNESIUM - Abnormal; Notable for the following components:    Magnesium 1.6 (*)    All other components within normal limits  URINALYSIS, MICROSCOPIC (REFLEX) - Abnormal; Notable for the following components:   Bacteria, UA RARE (*)    All other components within normal limits  RESP PANEL BY RT-PCR (RSV, FLU A&B, COVID)  RVPGX2  LIPASE, BLOOD    EKG None  Radiology CT ABDOMEN PELVIS W CONTRAST Result Date: 11/20/2023 CLINICAL DATA:  Worsening lower abdominal pain. Nausea and vomiting for 1 month. EXAM: CT ABDOMEN AND PELVIS WITH  CONTRAST TECHNIQUE: Multidetector CT imaging of the abdomen and pelvis was performed using the standard protocol following bolus administration of intravenous contrast. RADIATION DOSE REDUCTION: This exam was performed according to the departmental dose-optimization program which includes automated exposure control, adjustment of the mA and/or kV according to patient size and/or use of iterative reconstruction technique. CONTRAST:  60mL OMNIPAQUE IOHEXOL 350 MG/ML SOLN COMPARISON:  Noncontrast CT on 11/18/2023 FINDINGS: Lower Chest: No acute findings. Hepatobiliary: Focal fatty infiltration again seen adjacent to the falciform ligament. Several small hypervascular liver lesions are seen in the right hepatic lobe, largest measuring 1.5 cm on image 11/3. Gallbladder is unremarkable. No evidence of biliary ductal dilatation. Pancreas:  No mass or inflammatory changes. Spleen: Within normal limits in size and appearance. Adrenals/Urinary Tract: No suspicious masses identified. No evidence of ureteral calculi or hydronephrosis. Unremarkable unopacified urinary bladder. Stomach/Bowel: No evidence of obstruction, inflammatory process or abnormal fluid collections. Normal appendix visualized. Diverticulosis is seen mainly involving the descending and sigmoid colon, however there is no evidence of diverticulitis. Vascular/Lymphatic: No pathologically enlarged lymph nodes. No acute vascular findings. Reproductive:  No mass or other significant  abnormality. Other:  None. Musculoskeletal:  No suspicious bone lesions identified. IMPRESSION: No acute findings. Colonic diverticulosis, without radiographic evidence of diverticulitis. Several small hypervascular liver lesions, largest measuring 1.5 cm. These are nonspecific, and differential diagnosis includes hemangiomas, focal nodular hyperplasia, and adenomas. Recommend nonemergent outpatient abdomen MRI without and with contrast for further characterization. Electronically Signed   By: Danae Orleans M.D.   On: 11/20/2023 14:41    Procedures Procedures    Medications Ordered in ED Medications  magnesium sulfate IVPB 2 g 50 mL (2 g Intravenous New Bag/Given 11/20/23 1438)  ondansetron (ZOFRAN) injection 4 mg (4 mg Intravenous Given 11/20/23 1304)  pantoprazole (PROTONIX) injection 40 mg (40 mg Intravenous Given 11/20/23 1304)  sodium chloride 0.9 % bolus 1,000 mL (0 mLs Intravenous Stopped 11/20/23 1424)  iohexol (OMNIPAQUE) 350 MG/ML injection 60 mL (60 mLs Intravenous Contrast Given 11/20/23 1353)  ondansetron (ZOFRAN) injection 4 mg (4 mg Intravenous Given 11/20/23 1439)    ED Course/ Medical Decision Making/ A&P                                 Medical Decision Making Amount and/or Complexity of Data Reviewed Labs: ordered. Radiology: ordered.  Risk Prescription drug management.   Victoria Rose 64 y.o. presented today for nausea vomiting.  Working DDx that I considered at this time includes, but not limited to, gastroenteritis, colitis, small bowel obstruction, appendicitis, cholecystitis, hepatobiliary pathology, gastritis, PUD, ACS, aortic dissection, diverticulosis/diverticulitis, pancreatitis, nephrolithiasis, medication induced, AAA, UTI, pyelonephritis, ruptured ectopic pregnancy, PID, ovarian torsion.  R/o DDx: colitis, small bowel obstruction, appendicitis, cholecystitis, hepatobiliary pathology, gastritis, PUD, ACS, aortic dissection, diverticulosis/diverticulitis,  pancreatitis, nephrolithiasis, medication induced, AAA, UTI, pyelonephritis, ruptured ectopic pregnancy, PID, ovarian torsion: These are considered less likely due to history of present illness, physical exam, labs/imaging findings.  Review of prior external notes: 09/02/2023 office visit  Unique Tests and My Independent Interpretation:  CBC with differential: Slightly elevated hemoglobin 17.6, improving leukocytosis 12.5 CMP: Slightly elevated creatinine 1.26, anion gap 16 which is better than the other day, GFR 48, CO2 17 Magnesium: 1.6 Lipase: Unremarkable UA: Unremarkable EKG: Rate, rhythm, axis, intervals all examined and without medically relevant abnormality. ST segments without concerns for elevations CT Abd/Pelvis with contrast: Liver hemangioma  Social Determinants of Health: none  Discussion with Independent Historian:  Family member  Discussion of Management of Tests: None  Risk: Medium: prescription drug management  Risk Stratification Score: None  Staffed with Young, DO  Plan: On exam patient was no acute distress with stable vitals.  Patient is mildly tachycardic however do feel this is dehydration.  Will give fluids along Protonix and Zofran and recheck labs.  Patient's potassium was low the other day and so we will add on a magnesium as well.  CT scan with contrast was ordered.  Labs do show improving leukocytosis along with stable potassium and downtrending anion gap.  Creatinine is slightly elevated however not AKI range.  Anion gap is most likely from starvation ketoacidosis as patient has not been able to tolerate p.o. reportedly.  Labs do show slightly low magnesium so we will replenish this.  Still waiting on the CT scan and urine analysis have the patient became nauseous again and so we will give another round of Zofran.  Patient has been able to tolerate Sprite p.o. here and has not had any episodes of emesis.  CT scan does show a liver hemangioma however  patient is aware of this and already had the abdominal MRI.  Patient I had shared decision making I offered admission however patient I agreed that since patient is feeling better and able to tolerate p.o. she is safe to go home.  I strongly encouraged her to follow-up closely with her primary care provider and to remain hydrated use the Zofran previously prescribed to her.  At this time with patient's leukocytosis resolving and normal CT scan twice in 1 week do feel patient has a viral illness.  Patient was given return precautions. Patient stable for discharge at this time.  Patient verbalized understanding of plan.  This chart was dictated using voice recognition software.  Despite best efforts to proofread,  errors can occur which can change the documentation meaning.        Final Clinical Impression(s) / ED Diagnoses Final diagnoses:  Hypomagnesemia  Viral illness    Rx / DC Orders ED Discharge Orders     None         Netta Corrigan, PA-C 11/20/23 1529    Coral Spikes, DO 11/20/23 1707

## 2023-11-24 ENCOUNTER — Other Ambulatory Visit (HOSPITAL_BASED_OUTPATIENT_CLINIC_OR_DEPARTMENT_OTHER): Payer: Self-pay | Admitting: Student

## 2023-11-24 ENCOUNTER — Encounter: Payer: Self-pay | Admitting: Gastroenterology

## 2023-11-24 DIAGNOSIS — J449 Chronic obstructive pulmonary disease, unspecified: Secondary | ICD-10-CM

## 2023-11-25 ENCOUNTER — Ambulatory Visit: Payer: Self-pay

## 2023-11-25 NOTE — Telephone Encounter (Signed)
  Chief Complaint: vomiting Symptoms: moderate vomiting Frequency: x 2 weeks Pertinent Negatives: Patient denies blood in vomit, signs of dehydration, fever, dizziness, abdominal pain Disposition: [] ED /[] Urgent Care (no appt availability in office) / [x] Appointment(In office/virtual)/ []  [facility]/ [] Home Care/ [] Refused Recommended Disposition /[] [place] [facility]/ []  Follow-up with PCP Additional Notes: Patient reports she has been experiencing moderate vomiting over the last 2 weeks. Patient reports she was seen in the ED last week and was given fluids and told that she likely had a virus. Patient reports her vomiting has not gotten worse since then, but it has not decreased either. Patient reports approx 3-4 episodes of vomiting a day. Patient reports she is finally able to keep down food and drink and is not experiencing any signs of dehydration. Patient reports she was given Zofran in the ED but was not sent home with a script. Per protocol, appt scheduled in office. Patient requested tomorrow as her earliest available, so appt scheduled 11/26/23. Patient advised to call back with worsening symptoms. Patient verbalized understanding.    Reason for Disposition  [1] MILD or MODERATE vomiting AND [2] present > 48 hours (2 days) (Exception: Mild vomiting with associated diarrhea.)  Answer Assessment - Initial Assessment Questions 1. VOMITING SEVERITY: "How many times have you vomited in the past 24 hours?"     - MILD:  1 - 2 times/day    - MODERATE: 3 - 5 times/day, decreased oral intake without significant weight loss or symptoms of dehydration    - SEVERE: 6 or more times/day, vomits everything or nearly everything, with significant weight loss, symptoms of dehydration      moderate 2. ONSET: "When did the vomiting begin?"      "A few weeks" 3. FLUIDS: "What fluids or food have you vomited up today?" "Have you been able to keep any fluids down?"     Had fluids in ED last  week, drinking week 4. ABDOMEN PAIN: "Are your having any abdomen pain?" If Yes : "How bad is it and what does it feel like?" (e.g., crampy, dull, intermittent, constant)      none 5. DIARRHEA: "Is there any diarrhea?" If Yes, ask: "How many times today?"      none 6. CONTACTS: "Is there anyone else in the family with the same symptoms?"      none 7. CAUSE: "What do you think is causing your vomiting?"     Viral illness 8. HYDRATION STATUS: "Any signs of dehydration?" (e.g., dry mouth [not only dry lips], too weak to stand) "When did you last urinate?"    Able to eat and drink 9. OTHER SYMPTOMS: "Do you have any other symptoms?" (e.g., fever, headache, vertigo, vomiting blood or coffee grounds, recent head injury)     none  Protocols used: Vomiting-A-AH

## 2023-11-26 ENCOUNTER — Encounter (HOSPITAL_BASED_OUTPATIENT_CLINIC_OR_DEPARTMENT_OTHER): Payer: Self-pay

## 2023-11-26 ENCOUNTER — Ambulatory Visit (HOSPITAL_BASED_OUTPATIENT_CLINIC_OR_DEPARTMENT_OTHER): Admitting: Student

## 2023-11-26 NOTE — Progress Notes (Deleted)
   Acute Office Visit  Subjective:     Patient ID: Victoria Rose, female    DOB: [birth year], 63 y.o.   MRN: [id number]  No chief complaint on file.   HPI  Discussed the use of AI scribe software for clinical note transcription with the patient, who gave verbal consent to proceed.  History of Present Illness          ROS  Per HPI    Objective:    There were no vitals taken for this visit. {Vitals History (Optional):23777}  Physical Exam  No results found for any visits on 11/26/23.      Assessment & Plan:   ***  No follow-ups on file.  Teryl Lucy Refugio Vandevoorde, PA-C

## 2023-12-03 ENCOUNTER — Encounter (HOSPITAL_BASED_OUTPATIENT_CLINIC_OR_DEPARTMENT_OTHER): Payer: Self-pay | Admitting: Student

## 2023-12-03 ENCOUNTER — Ambulatory Visit (INDEPENDENT_AMBULATORY_CARE_PROVIDER_SITE_OTHER): Admitting: Student

## 2023-12-03 VITALS — BP 131/81 | HR 94 | Temp 98.0°F | Resp 16 | Ht 64.0 in | Wt 123.5 lb

## 2023-12-03 DIAGNOSIS — E119 Type 2 diabetes mellitus without complications: Secondary | ICD-10-CM

## 2023-12-03 DIAGNOSIS — Z131 Encounter for screening for diabetes mellitus: Secondary | ICD-10-CM

## 2023-12-03 DIAGNOSIS — I10 Essential (primary) hypertension: Secondary | ICD-10-CM

## 2023-12-03 DIAGNOSIS — R1115 Cyclical vomiting syndrome unrelated to migraine: Secondary | ICD-10-CM | POA: Diagnosis not present

## 2023-12-03 DIAGNOSIS — Z13220 Encounter for screening for lipoid disorders: Secondary | ICD-10-CM

## 2023-12-03 DIAGNOSIS — Z136 Encounter for screening for cardiovascular disorders: Secondary | ICD-10-CM

## 2023-12-03 MED ORDER — AMITRIPTYLINE HCL 50 MG PO TABS: 50.0000 mg | ORAL_TABLET | Freq: Every day | ORAL | 2 refills | Status: DC

## 2023-12-03 MED ORDER — PROMETHAZINE HCL 25 MG PO TABS
25.0000 mg | ORAL_TABLET | Freq: Three times a day (TID) | ORAL | 3 refills | Status: DC | PRN
Start: 2023-12-03 — End: 2024-03-03

## 2023-12-03 NOTE — Assessment & Plan Note (Signed)
 Stable continue current regimen.

## 2023-12-03 NOTE — Assessment & Plan Note (Signed)
 Chronic nausea and vomiting with recent exacerbation. Vomiting is watery, sometimes with mucus, but no blood or bile. Suspected cyclic vomiting syndrome, possibly related to gastroparesis or other gastrointestinal issues. No recent marijuana use. Phenergan has been effective in controlling symptoms. Amitriptyline will be increased gradually. Referral to a GI specialist is necessary for further evaluation. She prefers to see a specialist in [place]. - Prescribe Phenergan with refills- may consider sumatriptan if it wanes in effectiveness - Increase amitriptyline to 50 mg at bedtime, with a plan to increase by 25 mg weekly until reaching 100 mg or until side effects occur - Refer to GI specialist in [facility] for further evaluation - Advise to avoid marijuana use

## 2023-12-03 NOTE — Assessment & Plan Note (Addendum)
 Stable. Recent blood work needed to assess A1c levels. Hydration is important, and Pedialyte is recommended with a limit due to sugar content. - Continue current regimen. - Order A1c test - Advise on hydration with Pedialyte, limiting to two per day due to sugar content

## 2023-12-03 NOTE — Patient Instructions (Addendum)
 It was nice to see you today!  As we discussed in clinic:  Please continue to abstain from marijuana- it can take several months of being off of marijuana to really help nausea.  We will also increase your amitriptyline to 50mg  nightly. If we have to go higher, we may need to decrease your sertraline dose.  Please call the GI doc to make an appointment, if they need a referral please let me know.  If you continue to vomit, you may need to be seen at the ER again to replenish fluids. Please look out for any fever, severe abdominal pain, chest pain, shortness of breath, or worsening symptoms overall- this should lead you to the hospital.  If you have any problems before your next visit feel free to message me via MyChart (minor issues or questions) or call the office, otherwise you may reach out to schedule an office visit.  Thank you! Burrel Legrand, PA-C

## 2023-12-03 NOTE — Progress Notes (Signed)
 Established Patient Office Visit  Subjective   Patient ID: Victoria Rose, female    DOB: [birth year]  Age: 63 y.o. MRN: [id number]  Chief Complaint  Patient presents with   Hospitalization Follow-up    ER follow up, was at [facility] 11/26/2023. Was given 5 days of magnesium, 2 bags of fluid, and promethazine for nausea/vomiting. Was told she was severely dehydrated. Was able to eat yesterday for first time in a while. Woke up around 3 am to throw up. Needs refill for promethazine.     HPI  Discussed the use of AI scribe software for clinical note transcription with the patient, who gave verbal consent to proceed.  History of Present Illness   Victoria Rose is a 63 year old female with cyclic vomiting syndrome who presents with persistent nausea and vomiting.  She has been experiencing persistent nausea and vomiting despite previous treatments. Phenergan was previously prescribed, which helped her keep food down, but she has run out of the medication. Monday was the first time she was able to eat anything, but she vomited again this morning and has been unable to keep anything down since then.  She has a history of cyclic vomiting syndrome and is concerned about the underlying cause, questioning whether it might be related to gastroparesis. She has not had an endoscopy, which she believes would have been beneficial to perform alongside her previous colonoscopy. Her vomit is watery, sometimes with mucus, but there is no blood or bile.  She is currently taking amitriptyline 25 mg at bedtime, which was previously reduced from 50 mg due to interactions with other medications such as lorazepam and pain pills. She is not currently taking lorazepam.  She has a history of marijuana use but has been abstinent for a couple of weeks. She acknowledges that marijuana use can contribute to cyclic vomiting syndrome and is committed to staying off it.  She experiences night sweats, feeling cold one  minute and sweating the next. She also mentions soreness in her abdomen due to frequent vomiting, which causes pain when she coughs.      History (Optional):23778}  ROS Negative unless indicated in HPI.    Objective:     BP 131/81   Pulse 94   Temp 98 F (36.7 C) (Oral)   Resp 16   Ht 5\' 4"  (1.626 m)   Wt 123 lb 8 oz (56 kg)   SpO2 96%   BMI 21.20 kg/m    Physical Exam Constitutional:      General: She is not in acute distress.    Appearance: Normal appearance. She is not ill-appearing.  HENT:     Head: Normocephalic and atraumatic.     Nose: Nose normal.  Eyes:     General: No scleral icterus.    Conjunctiva/sclera: Conjunctivae normal.  Cardiovascular:     Rate and Rhythm: Normal rate and regular rhythm.     Heart sounds: Normal heart sounds. No murmur heard.    No friction rub.  Pulmonary:     Effort: Pulmonary effort is normal. No respiratory distress.     Breath sounds: Normal breath sounds. No wheezing, rhonchi or rales.  Abdominal:     General: Abdomen is flat. There is no distension.     Palpations: Abdomen is soft. There is no mass.     Tenderness: There is no abdominal tenderness. There is no guarding.  Musculoskeletal:        General: Normal range of motion.  Skin:  General: Skin is warm and dry.     Coloration: Skin is not jaundiced or pale.  Neurological:     General: No focal deficit present.     Mental Status: She is alert.  Psychiatric:        Mood and Affect: Mood normal.        Behavior: Behavior normal.      No results found for any visits on 12/03/23.    The 10-year ASCVD risk score (Arnett DK, et al., 2019) is: 18.6%    Assessment & Plan:   Cyclical vomiting Assessment & Plan: Chronic nausea and vomiting with recent exacerbation. Vomiting is watery, sometimes with mucus, but no blood or bile. Suspected cyclic vomiting syndrome, possibly related to gastroparesis or other gastrointestinal issues. No recent marijuana use.  Phenergan has been effective in controlling symptoms. Amitriptyline will be increased gradually. Referral to a GI specialist is necessary for further evaluation. She prefers to see a specialist in [place]. - Prescribe Phenergan with refills- may consider sumatriptan if it wanes in effectiveness - Increase amitriptyline to 50 mg at bedtime, with a plan to increase by 25 mg weekly until reaching 100 mg or until side effects occur - Refer to GI specialist in [facility] for further evaluation - Advise to avoid marijuana use  Orders: -     Promethazine HCl; Take 1 tablet (25 mg total) by mouth every 8 (eight) hours as needed.  Dispense: 30 tablet; Refill: 3 -     Amitriptyline HCl; Take 1 tablet (50 mg total) by mouth at bedtime.  Dispense: 90 tablet; Refill: 2 -     Basic metabolic panel with GFR -     Magnesium  Encounter for lipid screening for cardiovascular disease -     Lipid panel  Screening for diabetes mellitus -     Hemoglobin A1c  Type 2 diabetes mellitus without complication, without long-term current use of insulin (HCC) Assessment & Plan: Stable. Recent blood work needed to assess A1c levels. Hydration is important, and Pedialyte is recommended with a limit due to sugar content. - Continue current regimen. - Order A1c test - Advise on hydration with Pedialyte, limiting to two per day due to sugar content   Essential (primary) hypertension Assessment & Plan: Stable- continue current regimen.   General Health Maintenance Routine health maintenance and screenings are ongoing. Recent colonoscopy showed no cancer, only precancerous polyps which were removed. Blood work is needed to monitor cholesterol, electrolytes, kidney function, and magnesium levels. - Order cholesterol panel - Recheck electrolytes, kidney function, and magnesium levels     Return in about 3 months (around 03/03/2024), or if symptoms worsen or fail to improve.    Sabel Hornbeck T Zaliyah Meikle, PA-C

## 2023-12-04 ENCOUNTER — Encounter (HOSPITAL_BASED_OUTPATIENT_CLINIC_OR_DEPARTMENT_OTHER): Payer: Self-pay | Admitting: Student

## 2023-12-04 ENCOUNTER — Other Ambulatory Visit (HOSPITAL_BASED_OUTPATIENT_CLINIC_OR_DEPARTMENT_OTHER): Payer: Self-pay | Admitting: Student

## 2023-12-04 LAB — LIPID PANEL
Chol/HDL Ratio: 2.5 ratio (ref 0.0–4.4)
Cholesterol, Total: 141 mg/dL (ref 100–199)
HDL: 56 mg/dL (ref >39)
LDL Chol Calc (NIH): 66 mg/dL (ref 0–99)
Triglycerides: 101 mg/dL (ref 0–149)
VLDL Cholesterol Cal: 19 mg/dL (ref 5–40)

## 2023-12-04 LAB — BASIC METABOLIC PANEL WITH GFR
BUN/Creatinine Ratio: 29 — ABNORMAL HIGH (ref 12–28)
BUN: 20 mg/dL (ref 8–27)
CO2: 25 mmol/L (ref 20–29)
Calcium: 10.7 mg/dL — ABNORMAL HIGH (ref 8.7–10.3)
Chloride: 97 mmol/L (ref 96–106)
Creatinine, Ser: 0.7 mg/dL (ref 0.57–1.00)
Glucose: 98 mg/dL (ref 70–99)
Potassium: 3.7 mmol/L (ref 3.5–5.2)
Sodium: 140 mmol/L (ref 134–144)
eGFR: 97 mL/min/{1.73_m2} (ref >59)

## 2023-12-04 LAB — MAGNESIUM: Magnesium: 1.2 mg/dL — ABNORMAL LOW (ref 1.6–2.3)

## 2023-12-04 LAB — HEMOGLOBIN A1C
Est. average glucose Bld gHb Est-mCnc: 123 mg/dL
Hgb A1c MFr Bld: 5.9 % — ABNORMAL HIGH (ref 4.8–5.6)

## 2023-12-04 MED ORDER — MAGNESIUM OXIDE -MG SUPPLEMENT 400 (240 MG) MG PO TABS: 1.0000 | ORAL_TABLET | Freq: Two times a day (BID) | ORAL | 1 refills | Status: DC

## 2023-12-27 ENCOUNTER — Other Ambulatory Visit (HOSPITAL_BASED_OUTPATIENT_CLINIC_OR_DEPARTMENT_OTHER): Payer: Self-pay | Admitting: Student

## 2023-12-30 ENCOUNTER — Other Ambulatory Visit (HOSPITAL_BASED_OUTPATIENT_CLINIC_OR_DEPARTMENT_OTHER)

## 2023-12-30 ENCOUNTER — Other Ambulatory Visit (HOSPITAL_BASED_OUTPATIENT_CLINIC_OR_DEPARTMENT_OTHER): Payer: Self-pay

## 2023-12-30 DIAGNOSIS — R1115 Cyclical vomiting syndrome unrelated to migraine: Secondary | ICD-10-CM

## 2023-12-31 ENCOUNTER — Ambulatory Visit (HOSPITAL_BASED_OUTPATIENT_CLINIC_OR_DEPARTMENT_OTHER): Payer: Self-pay | Admitting: Student

## 2023-12-31 LAB — BASIC METABOLIC PANEL WITH GFR
BUN/Creatinine Ratio: 20 (ref 12–28)
BUN: 15 mg/dL (ref 8–27)
CO2: 20 mmol/L (ref 20–29)
Calcium: 10 mg/dL (ref 8.7–10.3)
Chloride: 100 mmol/L (ref 96–106)
Creatinine, Ser: 0.74 mg/dL (ref 0.57–1.00)
Glucose: 81 mg/dL (ref 70–99)
Potassium: 3.9 mmol/L (ref 3.5–5.2)
Sodium: 141 mmol/L (ref 134–144)
eGFR: 90 mL/min/{1.73_m2} (ref >59)

## 2023-12-31 LAB — MAGNESIUM: Magnesium: 1.7 mg/dL (ref 1.6–2.3)

## 2024-01-03 ENCOUNTER — Other Ambulatory Visit (HOSPITAL_BASED_OUTPATIENT_CLINIC_OR_DEPARTMENT_OTHER): Payer: Self-pay | Admitting: Student

## 2024-01-13 ENCOUNTER — Other Ambulatory Visit (HOSPITAL_BASED_OUTPATIENT_CLINIC_OR_DEPARTMENT_OTHER): Payer: Self-pay | Admitting: Student

## 2024-01-27 ENCOUNTER — Other Ambulatory Visit (HOSPITAL_BASED_OUTPATIENT_CLINIC_OR_DEPARTMENT_OTHER): Payer: Self-pay | Admitting: Student

## 2024-01-27 DIAGNOSIS — J449 Chronic obstructive pulmonary disease, unspecified: Secondary | ICD-10-CM

## 2024-01-31 ENCOUNTER — Other Ambulatory Visit (HOSPITAL_BASED_OUTPATIENT_CLINIC_OR_DEPARTMENT_OTHER): Payer: Self-pay | Admitting: Student

## 2024-02-06 ENCOUNTER — Other Ambulatory Visit (HOSPITAL_BASED_OUTPATIENT_CLINIC_OR_DEPARTMENT_OTHER): Payer: Self-pay | Admitting: Student

## 2024-02-06 DIAGNOSIS — J449 Chronic obstructive pulmonary disease, unspecified: Secondary | ICD-10-CM

## 2024-03-03 ENCOUNTER — Encounter (HOSPITAL_BASED_OUTPATIENT_CLINIC_OR_DEPARTMENT_OTHER): Payer: Self-pay | Admitting: Student

## 2024-03-03 ENCOUNTER — Ambulatory Visit (INDEPENDENT_AMBULATORY_CARE_PROVIDER_SITE_OTHER): Admitting: Student

## 2024-03-03 VITALS — BP 124/81 | HR 77 | Temp 98.5°F | Resp 16 | Ht 64.0 in | Wt 118.0 lb

## 2024-03-03 DIAGNOSIS — F17200 Nicotine dependence, unspecified, uncomplicated: Secondary | ICD-10-CM | POA: Diagnosis not present

## 2024-03-03 DIAGNOSIS — E785 Hyperlipidemia, unspecified: Secondary | ICD-10-CM

## 2024-03-03 DIAGNOSIS — E119 Type 2 diabetes mellitus without complications: Secondary | ICD-10-CM

## 2024-03-03 DIAGNOSIS — Z114 Encounter for screening for human immunodeficiency virus [HIV]: Secondary | ICD-10-CM

## 2024-03-03 DIAGNOSIS — D692 Other nonthrombocytopenic purpura: Secondary | ICD-10-CM | POA: Insufficient documentation

## 2024-03-03 DIAGNOSIS — Z1159 Encounter for screening for other viral diseases: Secondary | ICD-10-CM

## 2024-03-03 DIAGNOSIS — Z23 Encounter for immunization: Secondary | ICD-10-CM | POA: Diagnosis not present

## 2024-03-03 DIAGNOSIS — I10 Essential (primary) hypertension: Secondary | ICD-10-CM

## 2024-03-03 DIAGNOSIS — J449 Chronic obstructive pulmonary disease, unspecified: Secondary | ICD-10-CM

## 2024-03-03 DIAGNOSIS — R1115 Cyclical vomiting syndrome unrelated to migraine: Secondary | ICD-10-CM | POA: Diagnosis not present

## 2024-03-03 MED ORDER — ONDANSETRON 4 MG PO TBDP: 4.0000 mg | ORAL_TABLET | Freq: Three times a day (TID) | ORAL | 0 refills | Status: DC | PRN

## 2024-03-03 MED ORDER — PROMETHAZINE HCL 25 MG PO TABS: 25.0000 mg | ORAL_TABLET | Freq: Three times a day (TID) | ORAL | 3 refills | Status: AC | PRN

## 2024-03-03 MED ORDER — AMITRIPTYLINE HCL 50 MG PO TABS: 50.0000 mg | ORAL_TABLET | Freq: Every day | ORAL | 2 refills | Status: AC

## 2024-03-03 NOTE — Patient Instructions (Signed)
 It was nice to see you today!  As we discussed in clinic:  1-800-QUIT-NOW  If you have any problems before your next visit feel free to message me via MyChart (minor issues or questions) or call the office, otherwise you may reach out to schedule an office visit.  Thank you! Natha Guin, PA-C

## 2024-03-03 NOTE — Assessment & Plan Note (Addendum)
 Chronic, still not at goal. Vomiting occurs a couple of times a week, suspected to be cyclic vomiting syndrome. Marijuana-induced vomiting is considered, with cessation potentially taking months to resolve symptoms. Amitriptyline  aids in rest and may help manage cyclic vomiting. The risk of serotonin syndrome is considered due to concurrent Zoloft  use, so amitriptyline  dosage will not be increased. - Urge patient to call GI specialist for further evaluation - Continue amitriptyline  50 mg daily at bedtime - Discontinue Zofran  and prescribe Phenergan  for nausea, suspect less risk for serotonin syndrome. - Order labs to monitor health status due to vomiting - Continue protonix .

## 2024-03-03 NOTE — Assessment & Plan Note (Signed)
 Chronic, stable. On Lipitor for cholesterol management with no muscle aches or joint pains. - Continue Lipitor

## 2024-03-03 NOTE — Assessment & Plan Note (Signed)
 Chronic, stable. A1c is well-controlled with metformin . She is also taking Protonix  and magnesium  oxide. - Continue metformin 

## 2024-03-03 NOTE — Assessment & Plan Note (Signed)
 Uses Ventolin  and Incruse for COPD management. Crackles noted bilaterally, lower down, consistent with COPD. Shares albuterol  with her sister for nebulizer use and has DuoNebs at home for emergencies. - Continue Ventolin  and Incruse - Encourage smoking cessation and provide resources for support
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
## 2024-03-03 NOTE — Assessment & Plan Note (Addendum)
 Chronic, stable. Blood pressure is well-controlled with amlodipine  and Zestoretic . - Continue current antihypertensive medications - Advise to avoid salt at home

## 2024-03-10 LAB — COMPREHENSIVE METABOLIC PANEL WITH GFR
ALT: 8 IU/L (ref 0–32)
AST: 12 IU/L (ref 0–40)
Albumin: 4.2 g/dL (ref 3.9–4.9)
Alkaline Phosphatase: 66 IU/L (ref 44–121)
BUN/Creatinine Ratio: 20 (ref 12–28)
BUN: 12 mg/dL (ref 8–27)
Bilirubin Total: <0.2 mg/dL (ref 0.0–1.2)
CO2: 22 mmol/L (ref 20–29)
Calcium: 9.5 mg/dL (ref 8.7–10.3)
Chloride: 102 mmol/L (ref 96–106)
Creatinine, Ser: 0.61 mg/dL (ref 0.57–1.00)
Globulin, Total: 2 g/dL (ref 1.5–4.5)
Glucose: 81 mg/dL (ref 70–99)
Potassium: 4.4 mmol/L (ref 3.5–5.2)
Sodium: 140 mmol/L (ref 134–144)
Total Protein: 6.2 g/dL (ref 6.0–8.5)
eGFR: 100 mL/min/1.73 (ref >59)

## 2024-03-10 LAB — CBC WITH DIFFERENTIAL/PLATELET
Basophils Absolute: 0.1 x10E3/uL (ref 0.0–0.2)
Basos: 1 %
EOS (ABSOLUTE): 0.1 x10E3/uL (ref 0.0–0.4)
Eos: 1 %
Hematocrit: 48.6 % — ABNORMAL HIGH (ref 34.0–46.6)
Hemoglobin: 15.4 g/dL (ref 11.1–15.9)
Immature Grans (Abs): 0 x10E3/uL (ref 0.0–0.1)
Immature Granulocytes: 0 %
Lymphocytes Absolute: 2.3 x10E3/uL (ref 0.7–3.1)
Lymphs: 22 %
MCH: 30.4 pg (ref 26.6–33.0)
MCHC: 31.7 g/dL (ref 31.5–35.7)
MCV: 96 fL (ref 79–97)
Monocytes Absolute: 0.6 x10E3/uL (ref 0.1–0.9)
Monocytes: 5 %
Neutrophils Absolute: 7.6 x10E3/uL — ABNORMAL HIGH (ref 1.4–7.0)
Neutrophils: 71 %
Platelets: 228 x10E3/uL (ref 150–450)
RBC: 5.06 x10E6/uL (ref 3.77–5.28)
RDW: 12.6 % (ref 11.7–15.4)
WBC: 10.6 x10E3/uL (ref 3.4–10.8)

## 2024-03-10 LAB — HCV RNA QUANT RFLX ULTRA OR GENOTYP: HCV Quant Baseline: NOT DETECTED [IU]/mL

## 2024-03-10 LAB — HEMOGLOBIN A1C
Est. average glucose Bld gHb Est-mCnc: 103 mg/dL
Hgb A1c MFr Bld: 5.2 % (ref 4.8–5.6)

## 2024-03-10 LAB — LIPID PANEL
Chol/HDL Ratio: 2.6 ratio (ref 0.0–4.4)
Cholesterol, Total: 137 mg/dL (ref 100–199)
HDL: 52 mg/dL (ref >39)
LDL Chol Calc (NIH): 63 mg/dL (ref 0–99)
Triglycerides: 121 mg/dL (ref 0–149)
VLDL Cholesterol Cal: 22 mg/dL (ref 5–40)

## 2024-03-10 LAB — HIV ANTIBODY (ROUTINE TESTING W REFLEX)

## 2024-03-15 ENCOUNTER — Ambulatory Visit (HOSPITAL_BASED_OUTPATIENT_CLINIC_OR_DEPARTMENT_OTHER): Payer: Self-pay | Admitting: Student

## 2024-03-24 ENCOUNTER — Encounter (HOSPITAL_BASED_OUTPATIENT_CLINIC_OR_DEPARTMENT_OTHER): Payer: Self-pay

## 2024-04-23 ENCOUNTER — Other Ambulatory Visit (HOSPITAL_BASED_OUTPATIENT_CLINIC_OR_DEPARTMENT_OTHER): Payer: Self-pay | Admitting: Student

## 2024-04-23 DIAGNOSIS — R112 Nausea with vomiting, unspecified: Secondary | ICD-10-CM

## 2024-04-27 ENCOUNTER — Other Ambulatory Visit (HOSPITAL_BASED_OUTPATIENT_CLINIC_OR_DEPARTMENT_OTHER): Payer: Self-pay | Admitting: Student

## 2024-04-27 DIAGNOSIS — J449 Chronic obstructive pulmonary disease, unspecified: Secondary | ICD-10-CM

## 2024-05-27 ENCOUNTER — Other Ambulatory Visit (HOSPITAL_BASED_OUTPATIENT_CLINIC_OR_DEPARTMENT_OTHER): Payer: Self-pay | Admitting: Student

## 2024-05-27 DIAGNOSIS — J449 Chronic obstructive pulmonary disease, unspecified: Secondary | ICD-10-CM

## 2024-06-03 ENCOUNTER — Ambulatory Visit (INDEPENDENT_AMBULATORY_CARE_PROVIDER_SITE_OTHER): Admitting: Student

## 2024-06-03 ENCOUNTER — Encounter (HOSPITAL_BASED_OUTPATIENT_CLINIC_OR_DEPARTMENT_OTHER): Payer: Self-pay | Admitting: Student

## 2024-06-03 VITALS — BP 126/80 | HR 66 | Temp 97.9°F | Resp 16 | Ht 64.0 in | Wt 123.5 lb

## 2024-06-03 DIAGNOSIS — I10 Essential (primary) hypertension: Secondary | ICD-10-CM

## 2024-06-03 DIAGNOSIS — Z716 Tobacco abuse counseling: Secondary | ICD-10-CM

## 2024-06-03 DIAGNOSIS — D692 Other nonthrombocytopenic purpura: Secondary | ICD-10-CM

## 2024-06-03 DIAGNOSIS — F17210 Nicotine dependence, cigarettes, uncomplicated: Secondary | ICD-10-CM

## 2024-06-03 DIAGNOSIS — J449 Chronic obstructive pulmonary disease, unspecified: Secondary | ICD-10-CM | POA: Diagnosis not present

## 2024-06-03 DIAGNOSIS — E785 Hyperlipidemia, unspecified: Secondary | ICD-10-CM

## 2024-06-03 DIAGNOSIS — E119 Type 2 diabetes mellitus without complications: Secondary | ICD-10-CM | POA: Diagnosis not present

## 2024-06-03 DIAGNOSIS — Z7984 Long term (current) use of oral hypoglycemic drugs: Secondary | ICD-10-CM

## 2024-06-03 DIAGNOSIS — Z122 Encounter for screening for malignant neoplasm of respiratory organs: Secondary | ICD-10-CM

## 2024-06-03 MED ORDER — NICOTINE 7 MG/24HR TD PT24: 7.0000 mg | MEDICATED_PATCH | Freq: Every day | TRANSDERMAL | 2 refills | Status: DC

## 2024-06-03 NOTE — Progress Notes (Signed)
 Established Patient Office Visit  Subjective   Patient ID: Victoria Rose, female    DOB: 1960-10-17  Age: 63 y.o. MRN: [id number]  Chief Complaint  Patient presents with   Medical Management of Chronic Issues    follow up     HPI  Discussed the use of AI scribe software for clinical note transcription with the patient, who gave verbal consent to proceed.  History of Present Illness   Victoria Rose is a 63 year old female with COPD and diabetes who presents for a follow-up visit.  Vomiting has resolved over the past two to three weeks. She did not meet with a GI doctor as previously planned and has not undergone an endoscopy, although she completed a colonoscopy in January.  She continues to use marijuana occasionally, but less frequently than before. She is currently taking metformin  1000 mg twice a day for diabetes, and her A1c was fine at the last visit. She has been fasting for lab work today.  For her COPD, she uses the DuoNube inhaler as needed and the Incruse inhaler daily. She also has an albuterol  inhaler available if needed. She smokes about five to six cigarettes a day, reduced from a pack and a half per day earlier this year.  She experiences frequent urination and some swelling in her ankles, but no muscle aches or joint pains from her cholesterol medication. She is on Zestoretic  and Norvasc  for blood pressure management, but does not have a blood pressure machine at home.  She feels anxious and fidgety, with anxiety being more prominent than depression. She is currently taking Zoloft  and amitriptyline  at night. No seizures.  She has not followed up with a dermatologist recently, although she has had several skin lesions removed in the past.         06/03/2024    8:47 AM 03/03/2024    8:42 AM 12/03/2023    1:09 PM  PHQ9 SCORE ONLY  PHQ-9 Total Score 4 0  0      Data saved with a previous flowsheet row definition      06/03/2024    8:48 AM 03/03/2024     8:42 AM 12/03/2023    1:10 PM 08/04/2023    8:31 AM  GAD 7 : Generalized Anxiety Score  Nervous, Anxious, on Edge 3 1 0 3  Control/stop worrying 3 0 0 3  Worry too much - different things 3 1 0 3  Trouble relaxing 1 1 0 3  Restless 3 1 0 2  Easily annoyed or irritable 1 1 0 3  Afraid - awful might happen 0 0 0 3  Total GAD 7 Score 14 5 0 20  Anxiety Difficulty Somewhat difficult Not difficult at all Not difficult at all Very difficult    Patient Active Problem List   Diagnosis Date Noted   Senile purpura 03/03/2024   Cyclical vomiting 12/03/2023   Tobacco use disorder 10/10/2023   Nausea and vomiting 08/04/2023   Chronic obstructive pulmonary disease (HCC) 08/04/2023   Coronary artery calcification seen on CT scan 09/18/2022   Cigarette smoker 09/18/2022   Cardiac murmur 09/18/2022   Prominent abdominal aortic pulsation 09/18/2022   Abnormal EKG 08/30/2022   Anxiety disorder 08/30/2022   Chronic arthritis 08/30/2022   Xerosis cutis 08/30/2022   Polycythemia, secondary 06/03/2021   Closed head injury 05/08/2018   Bursitis of hip 12/01/2017   Spinal stenosis 12/01/2017   Essential (primary) hypertension 12/01/2017   Hyperlipidemia 12/01/2017  Type 2 diabetes mellitus without complications (HCC) 12/01/2017   Vitamin D deficiency 07/28/2015   Past Medical History:  Diagnosis Date   Abnormal EKG    Allergy    Anxiety 07/24/2015   Anxiety disorder    Arthritis    hips   Asthma    Benign hypertension 12/01/2017   Bursitis of hip 12/01/2017   Cancer (HCC) 1977   Cervical   Chronic hip pain    Closed head injury 05/08/2018   COPD (chronic obstructive pulmonary disease) (HCC)    Diabetes mellitus (HCC) 12/01/2017   Diabetes mellitus without complication (HCC)    Essential (primary) hypertension    Hyperlipidemia    Hypertension    Hypertriglyceridemia 12/01/2017   Myocardial infarction (HCC)    Polycythemia, secondary 06/03/2021   Spinal stenosis 12/01/2017    Type 2 diabetes mellitus without complications (HCC)    Ulcer 2010   Bleeding   Vitamin D deficiency    Xerosis cutis    Social History   Tobacco Use   Smoking status: Every Day    Current packs/day: 0.50    Average packs/day: 0.5 packs/day for 55.8 years (27.9 ttl pk-yrs)    Types: Cigarettes    Start date: 1970    Passive exposure: Current   Smokeless tobacco: Never  Vaping Use   Vaping status: Never Used  Substance Use Topics   Alcohol use: Not Currently    Comment: occasionally   Drug use: Yes    Types: Marijuana    Comment: once in a while   Allergies  Allergen Reactions   Metoprolol Anaphylaxis   Aspirin Other (See Comments)    Bleeding ulcers   Nsaids Other (See Comments)    Bleeding ulcers      ROS Per HPI.    Objective:     BP 126/80   Pulse 66   Temp 97.9 F (36.6 C) (Oral)   Resp 16   Ht 5' 4 (1.626 m)   Wt 123 lb 8 oz (56 kg)   SpO2 97%   BMI 21.20 kg/m     Physical Exam Constitutional:      General: She is not in acute distress.    Appearance: Normal appearance. She is not ill-appearing.  HENT:     Head: Normocephalic and atraumatic.     Nose: Nose normal.  Eyes:     General: No scleral icterus.    Conjunctiva/sclera: Conjunctivae normal.  Cardiovascular:     Rate and Rhythm: Normal rate and regular rhythm.     Heart sounds: Normal heart sounds. No murmur heard.    No friction rub.  Pulmonary:     Effort: Pulmonary effort is normal. No respiratory distress.     Breath sounds: Normal breath sounds. No wheezing, rhonchi or rales.  Musculoskeletal:        General: Normal range of motion.  Skin:    General: Skin is warm and dry.     Coloration: Skin is not jaundiced or pale.  Neurological:     General: No focal deficit present.     Mental Status: She is alert.  Psychiatric:        Mood and Affect: Mood normal.        Behavior: Behavior normal.      Results for orders placed or performed in visit on 06/03/24  CBC with  Differential/Platelet  Result Value Ref Range   WBC 7.5 3.4 - 10.8 x10E3/uL   RBC 4.90 3.77 - 5.28 x10E6/uL  Hemoglobin 14.6 11.1 - 15.9 g/dL   Hematocrit 53.9 65.9 - 46.6 %   MCV 94 79 - 97 fL   MCH 29.8 26.6 - 33.0 pg   MCHC 31.7 31.5 - 35.7 g/dL   RDW 86.4 88.2 - 84.5 %   Platelets 223 150 - 450 x10E3/uL   Neutrophils 58 Not Estab. %   Lymphs 31 Not Estab. %   Monocytes 9 Not Estab. %   Eos 1 Not Estab. %   Basos 1 Not Estab. %   Neutrophils Absolute 4.4 1.4 - 7.0 x10E3/uL   Lymphocytes Absolute 2.3 0.7 - 3.1 x10E3/uL   Monocytes Absolute 0.6 0.1 - 0.9 x10E3/uL   EOS (ABSOLUTE) 0.1 0.0 - 0.4 x10E3/uL   Basophils Absolute 0.1 0.0 - 0.2 x10E3/uL   Immature Granulocytes 0 Not Estab. %   Immature Grans (Abs) 0.0 0.0 - 0.1 x10E3/uL  Comprehensive metabolic panel with GFR  Result Value Ref Range   Glucose 81 70 - 99 mg/dL   BUN 14 8 - 27 mg/dL   Creatinine, Ser 9.27 0.57 - 1.00 mg/dL   eGFR 93 >40 fO/fpw/8.26   BUN/Creatinine Ratio 19 12 - 28   Sodium 141 134 - 144 mmol/L   Potassium 4.3 3.5 - 5.2 mmol/L   Chloride 102 96 - 106 mmol/L   CO2 24 20 - 29 mmol/L   Calcium  9.7 8.7 - 10.3 mg/dL   Total Protein 6.5 6.0 - 8.5 g/dL   Albumin 4.3 3.9 - 4.9 g/dL   Globulin, Total 2.2 1.5 - 4.5 g/dL   Bilirubin Total 0.3 0.0 - 1.2 mg/dL   Alkaline Phosphatase 64 49 - 135 IU/L   AST 10 0 - 40 IU/L   ALT 8 0 - 32 IU/L  Lipid panel  Result Value Ref Range   Cholesterol, Total 149 100 - 199 mg/dL   Triglycerides 79 0 - 149 mg/dL   HDL 69 >60 mg/dL   VLDL Cholesterol Cal 15 5 - 40 mg/dL   LDL Chol Calc (NIH) 65 0 - 99 mg/dL   Chol/HDL Ratio 2.2 0.0 - 4.4 ratio  Hemoglobin A1c  Result Value Ref Range   Hgb A1c MFr Bld 5.7 (H) 4.8 - 5.6 %   Est. average glucose Bld gHb Est-mCnc 117 mg/dL    Last CBC Lab Results  Component Value Date   WBC 7.5 06/03/2024   HGB 14.6 06/03/2024   HCT 46.0 06/03/2024   MCV 94 06/03/2024   MCH 29.8 06/03/2024   RDW 13.5 06/03/2024   PLT 223  06/03/2024   Last metabolic panel Lab Results  Component Value Date   GLUCOSE 81 06/03/2024   NA 141 06/03/2024   K 4.3 06/03/2024   CL 102 06/03/2024   CO2 24 06/03/2024   BUN 14 06/03/2024   CREATININE 0.72 06/03/2024   EGFR 93 06/03/2024   CALCIUM  9.7 06/03/2024   PROT 6.5 06/03/2024   ALBUMIN 4.3 06/03/2024   LABGLOB 2.2 06/03/2024   BILITOT 0.3 06/03/2024   ALKPHOS 64 06/03/2024   AST 10 06/03/2024   ALT 8 06/03/2024   ANIONGAP 16 (H) 11/20/2023   Last lipids Lab Results  Component Value Date   CHOL 149 06/03/2024   HDL 69 06/03/2024   LDLCALC 65 06/03/2024   TRIG 79 06/03/2024   CHOLHDL 2.2 06/03/2024   Last hemoglobin A1c Lab Results  Component Value Date   HGBA1C 5.7 (H) 06/03/2024      The ASCVD Risk score (Arnett DK,  et al., 2019) failed to calculate for the following reasons:   Risk score cannot be calculated because patient has a medical history suggesting prior/existing ASCVD    Assessment & Plan:   Assessment and Plan    Chronic obstructive pulmonary disease (COPD) Chronic, stable. COPD is well-managed with current inhaler regimen. DuoNube is used as needed, Incruse is taken daily, and albuterol  is available for rescue use. Smoking cessation is advised to improve COPD outcomes. - Continue DuoNube as needed - Continue Incruse daily - Use albuterol  inhaler as needed - Encourage smoking cessation - Prescribe 7 mg nicotine patches to aid in smoking cessation  Nicotine dependence, current smoker She has reduced smoking from a pack and a half per day to 5-6 cigarettes per day. Smoking cessation is strongly encouraged to improve overall health and COPD outcomes. - Prescribe 7 mg nicotine patches - Encourage complete smoking cessation - Advise on potential use of surgical tape to secure patches if needed  Hypertension Chronic, stable. Hypertension is managed with Zestoretic  and Norvasc . Blood pressure readings are slightly elevated. She does not  have a home blood pressure monitor and is advised to obtain one for regular monitoring. Dietary modifications, such as reducing salt intake, are recommended. If blood pressure remains elevated, consider adjusting medication. - Encourage obtaining a home blood pressure monitor - Advise reducing salt intake - Recheck blood pressure in 6 weeks with nurse - Monitor blood pressure at home for 2 weeks and report readings - Consider increasing lisinopril  if blood pressure remains elevated - Assess CBC and CMP  Type 2 diabetes mellitus Chronic, stable. Type 2 diabetes is well-controlled with metformin  1000 mg twice daily. Previous A1c was within target range. - Continue metformin  1000 mg twice daily - Recheck A1c  Hyperlipidemia Chronic, stable. Hyperlipidemia is managed with current medication. No muscle aches or joint pains reported. - Continue current hyperlipidemia management - Assess lipids  Return in about 3 months (around 09/03/2024) for Chronic Followup.    Dilyn Smiles T Jenine Krisher, PA-C

## 2024-06-03 NOTE — Patient Instructions (Signed)
 It was nice to see you today!  If you have any problems before your next visit feel free to message me via MyChart (minor issues or questions) or call the office, otherwise you may reach out to schedule an office visit.  Thank you! Pau Banh, PA-C

## 2024-06-04 ENCOUNTER — Ambulatory Visit (HOSPITAL_BASED_OUTPATIENT_CLINIC_OR_DEPARTMENT_OTHER): Payer: Self-pay | Admitting: Student

## 2024-06-04 DIAGNOSIS — J219 Acute bronchiolitis, unspecified: Secondary | ICD-10-CM

## 2024-06-04 DIAGNOSIS — J439 Emphysema, unspecified: Secondary | ICD-10-CM

## 2024-06-04 LAB — CBC WITH DIFFERENTIAL/PLATELET
Basophils Absolute: 0.1 x10E3/uL (ref 0.0–0.2)
Basos: 1 %
EOS (ABSOLUTE): 0.1 x10E3/uL (ref 0.0–0.4)
Eos: 1 %
Hematocrit: 46 % (ref 34.0–46.6)
Hemoglobin: 14.6 g/dL (ref 11.1–15.9)
Immature Grans (Abs): 0 x10E3/uL (ref 0.0–0.1)
Immature Granulocytes: 0 %
Lymphocytes Absolute: 2.3 x10E3/uL (ref 0.7–3.1)
Lymphs: 31 %
MCH: 29.8 pg (ref 26.6–33.0)
MCHC: 31.7 g/dL (ref 31.5–35.7)
MCV: 94 fL (ref 79–97)
Monocytes Absolute: 0.6 x10E3/uL (ref 0.1–0.9)
Monocytes: 9 %
Neutrophils Absolute: 4.4 x10E3/uL (ref 1.4–7.0)
Neutrophils: 58 %
Platelets: 223 x10E3/uL (ref 150–450)
RBC: 4.9 x10E6/uL (ref 3.77–5.28)
RDW: 13.5 % (ref 11.7–15.4)
WBC: 7.5 x10E3/uL (ref 3.4–10.8)

## 2024-06-04 LAB — COMPREHENSIVE METABOLIC PANEL WITH GFR
ALT: 8 IU/L (ref 0–32)
AST: 10 IU/L (ref 0–40)
Albumin: 4.3 g/dL (ref 3.9–4.9)
Alkaline Phosphatase: 64 IU/L (ref 49–135)
BUN/Creatinine Ratio: 19 (ref 12–28)
BUN: 14 mg/dL (ref 8–27)
Bilirubin Total: 0.3 mg/dL (ref 0.0–1.2)
CO2: 24 mmol/L (ref 20–29)
Calcium: 9.7 mg/dL (ref 8.7–10.3)
Chloride: 102 mmol/L (ref 96–106)
Creatinine, Ser: 0.72 mg/dL (ref 0.57–1.00)
Globulin, Total: 2.2 g/dL (ref 1.5–4.5)
Glucose: 81 mg/dL (ref 70–99)
Potassium: 4.3 mmol/L (ref 3.5–5.2)
Sodium: 141 mmol/L (ref 134–144)
Total Protein: 6.5 g/dL (ref 6.0–8.5)
eGFR: 93 mL/min/1.73 (ref >59)

## 2024-06-04 LAB — HEMOGLOBIN A1C
Est. average glucose Bld gHb Est-mCnc: 117 mg/dL
Hgb A1c MFr Bld: 5.7 % — ABNORMAL HIGH (ref 4.8–5.6)

## 2024-06-04 LAB — LIPID PANEL
Chol/HDL Ratio: 2.2 ratio (ref 0.0–4.4)
Cholesterol, Total: 149 mg/dL (ref 100–199)
HDL: 69 mg/dL (ref >39)
LDL Chol Calc (NIH): 65 mg/dL (ref 0–99)
Triglycerides: 79 mg/dL (ref 0–149)
VLDL Cholesterol Cal: 15 mg/dL (ref 5–40)

## 2024-06-21 ENCOUNTER — Other Ambulatory Visit (HOSPITAL_BASED_OUTPATIENT_CLINIC_OR_DEPARTMENT_OTHER): Payer: Self-pay | Admitting: Student

## 2024-06-23 ENCOUNTER — Ambulatory Visit (INDEPENDENT_AMBULATORY_CARE_PROVIDER_SITE_OTHER)
Admission: RE | Admit: 2024-06-23 | Discharge: 2024-06-23 | Disposition: A | Discharge status: Home / Self Care | Source: Ambulatory Visit | Attending: Student | Admitting: Student

## 2024-06-23 DIAGNOSIS — Z122 Encounter for screening for malignant neoplasm of respiratory organs: Secondary | ICD-10-CM

## 2024-06-23 DIAGNOSIS — F17210 Nicotine dependence, cigarettes, uncomplicated: Secondary | ICD-10-CM

## 2024-06-28 ENCOUNTER — Other Ambulatory Visit (HOSPITAL_BASED_OUTPATIENT_CLINIC_OR_DEPARTMENT_OTHER): Payer: Self-pay | Admitting: Student

## 2024-06-30 DIAGNOSIS — J439 Emphysema, unspecified: Secondary | ICD-10-CM | POA: Insufficient documentation

## 2024-06-30 DIAGNOSIS — J219 Acute bronchiolitis, unspecified: Secondary | ICD-10-CM | POA: Insufficient documentation

## 2024-07-03 ENCOUNTER — Other Ambulatory Visit (HOSPITAL_BASED_OUTPATIENT_CLINIC_OR_DEPARTMENT_OTHER): Payer: Self-pay | Admitting: Student

## 2024-07-23 ENCOUNTER — Other Ambulatory Visit (HOSPITAL_BASED_OUTPATIENT_CLINIC_OR_DEPARTMENT_OTHER): Payer: Self-pay | Admitting: Student

## 2024-07-23 DIAGNOSIS — J449 Chronic obstructive pulmonary disease, unspecified: Secondary | ICD-10-CM

## 2024-07-23 DIAGNOSIS — I10 Essential (primary) hypertension: Secondary | ICD-10-CM

## 2024-07-23 DIAGNOSIS — F419 Anxiety disorder, unspecified: Secondary | ICD-10-CM

## 2024-07-23 DIAGNOSIS — I2510 Atherosclerotic heart disease of native coronary artery without angina pectoris: Secondary | ICD-10-CM

## 2024-07-23 NOTE — Telephone Encounter (Unsigned)
 Copied from CRM [id number]. Topic: Clinical - Medication Refill >> Jul 23, 2024  3:07 PM Donna E wrote: Medication:  -INCRUSE ELLIPTA  62.5 MCG/ACT AEPB -ipratropium-albuterol  (DUONEB) 0.5-2.5 (3) MG/3ML SOLN  -sertraline  (ZOLOFT ) 100 MG tablet  -amLODipine  (NORVASC ) 10 MG tablet   Has the patient contacted their pharmacy? Yes Pharmacy stated to call provided   This is the patient's preferred pharmacy:   [facility] - [place], [place] - [address] [place] [address] Phone: [phone] Fax: [phone]  Is this the correct pharmacy for this prescription? Yes If no, delete pharmacy and type the correct one.   Has the prescription been filled recently? Yes  Is the patient out of the medication? Yes  Has the patient been seen for an appointment in the last year OR does the patient have an upcoming appointment? Yes  Can we respond through MyChart? Yes  Agent: Please be advised that Rx refills may take up to 3 business days. We ask that you follow-up with your pharmacy.

## 2024-07-26 ENCOUNTER — Telehealth (HOSPITAL_BASED_OUTPATIENT_CLINIC_OR_DEPARTMENT_OTHER): Payer: Self-pay | Admitting: Student

## 2024-07-26 DIAGNOSIS — J449 Chronic obstructive pulmonary disease, unspecified: Secondary | ICD-10-CM

## 2024-07-26 DIAGNOSIS — F419 Anxiety disorder, unspecified: Secondary | ICD-10-CM

## 2024-07-26 MED ORDER — SERTRALINE HCL 100 MG PO TABS: 100.0000 mg | ORAL_TABLET | Freq: Every day | ORAL | 1 refills | Status: AC

## 2024-07-26 MED ORDER — AMLODIPINE BESYLATE 10 MG PO TABS: 10.0000 mg | ORAL_TABLET | Freq: Every day | ORAL | 1 refills | Status: AC

## 2024-07-26 MED ORDER — IPRATROPIUM-ALBUTEROL 0.5-2.5 (3) MG/3ML IN SOLN: 3.0000 mL | Freq: Four times a day (QID) | RESPIRATORY_TRACT | 3 refills | Status: AC | PRN

## 2024-07-26 MED ORDER — INCRUSE ELLIPTA 62.5 MCG/ACT IN AEPB: 1.0000 | INHALATION_SPRAY | Freq: Every day | RESPIRATORY_TRACT | 3 refills | Status: AC

## 2024-07-26 NOTE — Telephone Encounter (Unsigned)
 Copied from CRM #[id number]. Topic: Clinical - Medication Refill >> Jul 26, 2024 12:31 PM Jasmin G wrote: Medication: INCRUSE ELLIPTA  62.5 MCG/ACT AEPB, ipratropium-albuterol  (DUONEB) 0.5-2.5 (3) MG/3ML SOLN, sertraline  (ZOLOFT ) 100 MG tablet  Has the patient contacted their pharmacy? No (Agent: If no, request that the patient contact the pharmacy for the refill. If patient does not wish to contact the pharmacy document the reason why and proceed with request.) (Agent: If yes, when and what did the pharmacy advise?)  This is the patient's preferred pharmacy:  [facility] - [place], [place] - [address] [place] [address] Phone: [phone] Fax: [phone]  Is this the correct pharmacy for this prescription? Yes If no, delete pharmacy and type the correct one.   Has the prescription been filled recently? Yes  Is the patient out of the medication? Yes  Has the patient been seen for an appointment in the last year OR does the patient have an upcoming appointment? Yes  Can we respond through MyChart? No  Agent: Please be advised that Rx refills may take up to 3 business days. We ask that you follow-up with your pharmacy.

## 2024-07-27 ENCOUNTER — Other Ambulatory Visit (HOSPITAL_BASED_OUTPATIENT_CLINIC_OR_DEPARTMENT_OTHER): Payer: Self-pay | Admitting: Student

## 2024-07-27 DIAGNOSIS — J449 Chronic obstructive pulmonary disease, unspecified: Secondary | ICD-10-CM

## 2024-07-27 MED ORDER — ALBUTEROL SULFATE HFA 108 (90 BASE) MCG/ACT IN AERS: 2.0000 | INHALATION_SPRAY | Freq: Four times a day (QID) | RESPIRATORY_TRACT | 6 refills | Status: AC | PRN

## 2024-07-27 NOTE — Telephone Encounter (Unsigned)
 Copied from CRM #[id number]. Topic: Clinical - Medication Refill >> Jul 27, 2024  8:03 AM Antwanette L wrote: Medication: VENTOLIN  HFA 108 (90 Base) MCG/ACT inhaler  Has the patient contacted their pharmacy? Yes   This is the patient's preferred pharmacy:  [facility] - [place], [place] - [address] [place] [address] Phone: [phone] Fax: [phone]  Is this the correct pharmacy for this prescription? Yes   Has the prescription been filled recently? Yes. Last refill was on 05/27/24  Is the patient out of the medication? No  Has the patient been seen for an appointment in the last year OR does the patient have an upcoming appointment? Yes. Last ov with Jacob Rothfuss PA-C was on 06/03/24 and next appt is 09/03/24 Can we respond through MyChart? Yes  Agent: Please be advised that Rx refills may take up to 3 business days. We ask that you follow-up with your pharmacy.

## 2024-07-27 NOTE — Telephone Encounter (Signed)
 Copied from CRM #[id number]. Topic: Clinical - Medication Refill >> Jul 27, 2024  8:03 AM Antwanette L wrote: Medication: VENTOLIN  HFA 108 (90 Base) MCG/ACT inhaler  Has the patient contacted their pharmacy? Yes   This is the patient's preferred pharmacy:  [facility] - [place], [place] - [address] [place] [address] Phone: [phone] Fax: [phone]  Is this the correct pharmacy for this prescription? Yes   Has the prescription been filled recently? Yes. Last refill was on 05/27/24  Is the patient out of the medication? No  Has the patient been seen for an appointment in the last year OR does the patient have an upcoming appointment? Yes. Last ov with Jacob Rothfuss PA-C was on 06/03/24 and next appt is 09/03/24 Can we respond through MyChart? Yes  Agent: Please be advised that Rx refills may take up to 3 business days. We ask that you follow-up with your pharmacy. >> Jul 27, 2024  8:06 AM Antwanette L wrote: The patient just recently switched to [facility]  and they are asking for a new script.

## 2024-09-03 ENCOUNTER — Ambulatory Visit (INDEPENDENT_AMBULATORY_CARE_PROVIDER_SITE_OTHER): Admitting: Student

## 2024-09-03 ENCOUNTER — Encounter (HOSPITAL_BASED_OUTPATIENT_CLINIC_OR_DEPARTMENT_OTHER): Payer: Self-pay | Admitting: Student

## 2024-09-03 VITALS — BP 123/80 | HR 78 | Temp 97.9°F | Resp 16 | Ht 64.0 in | Wt 122.5 lb

## 2024-09-03 DIAGNOSIS — J449 Chronic obstructive pulmonary disease, unspecified: Secondary | ICD-10-CM

## 2024-09-03 DIAGNOSIS — Z780 Asymptomatic menopausal state: Secondary | ICD-10-CM

## 2024-09-03 DIAGNOSIS — E785 Hyperlipidemia, unspecified: Secondary | ICD-10-CM | POA: Diagnosis not present

## 2024-09-03 DIAGNOSIS — I10 Essential (primary) hypertension: Secondary | ICD-10-CM | POA: Diagnosis not present

## 2024-09-03 DIAGNOSIS — J439 Emphysema, unspecified: Secondary | ICD-10-CM | POA: Diagnosis not present

## 2024-09-03 DIAGNOSIS — Z7984 Long term (current) use of oral hypoglycemic drugs: Secondary | ICD-10-CM | POA: Diagnosis not present

## 2024-09-03 DIAGNOSIS — E119 Type 2 diabetes mellitus without complications: Secondary | ICD-10-CM

## 2024-09-03 DIAGNOSIS — K219 Gastro-esophageal reflux disease without esophagitis: Secondary | ICD-10-CM

## 2024-09-03 DIAGNOSIS — F411 Generalized anxiety disorder: Secondary | ICD-10-CM

## 2024-09-03 MED ORDER — BUSPIRONE HCL 7.5 MG PO TABS: ORAL_TABLET | ORAL | 3 refills | Status: AC

## 2024-09-03 MED ORDER — PANTOPRAZOLE SODIUM 40 MG PO TBEC: 40.0000 mg | DELAYED_RELEASE_TABLET | Freq: Two times a day (BID) | ORAL | 1 refills | Status: AC

## 2024-09-03 NOTE — Progress Notes (Signed)
 "  Established Patient Office Visit  Subjective   Patient ID: Victoria Rose, female    DOB: 07/17/1961  Age: 64 y.o. MRN: [id number]  Chief Complaint  Patient presents with   Medical Management of Chronic Issues    Follow up. Needs  to discuss zoloft .     HPI  Discussed the use of AI scribe software for clinical note transcription with the patient, who gave verbal consent to proceed.  History of Present Illness   Victoria Rose is a 64 year old female with anxiety who presents with uncontrolled anxiety.  She experiences anxiety that feels 'all out of control sometimes', with symptoms of fidgetiness and a constant need to be active. She is currently taking Zoloft  100 mg daily.  She has COPD and uses an Incruse Ellipta  inhaler, though she sometimes feels it is not effective. She has not had any recent lung function testing and does not recall having any in the past.  She has a history of gastroesophageal reflux disease and is taking Protonix  regularly for management.  She has diabetes and monitors her blood sugar at home, reporting normal readings. She is taking metformin  1000 mg twice daily, and her last A1c was 5.7%.  She smokes about five cigarettes per day and is attempting to reduce her smoking habit.  She has a history of arthritis, but no specific details about current symptoms or management were discussed.  She has had a bone density scan in the past but does not recall when. She is due for another scan.  No chest pain or shortness of breath.      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   Tobacco use disorder 10/10/2023   Nausea and vomiting 08/04/2023   Chronic obstructive pulmonary disease (HCC) 08/04/2023   Coronary artery calcification seen on CT scan 09/18/2022   Cigarette smoker 09/18/2022   Cardiac murmur 09/18/2022    Prominent abdominal aortic pulsation 09/18/2022   Abnormal EKG 08/30/2022   GAD (generalized anxiety disorder) 08/30/2022   Chronic arthritis 08/30/2022   Xerosis cutis 08/30/2022   Polycythemia, secondary 06/03/2021   Closed head injury 05/08/2018   Bursitis of hip 12/01/2017   Spinal stenosis 12/01/2017   Essential (primary) hypertension 12/01/2017   Hyperlipidemia 12/01/2017   Type 2 diabetes mellitus without complications (HCC) 12/01/2017   Vitamin D deficiency 07/28/2015   Past Medical History:  Diagnosis Date   Abnormal EKG    Allergy    Anxiety 07/24/2015   Anxiety disorder    Arthritis    hips   Asthma    Benign hypertension 12/01/2017   Bursitis of hip 12/01/2017   Cancer (HCC) 1977   Cervical   Chronic hip pain    Closed head injury 05/08/2018   COPD (chronic obstructive pulmonary disease) (HCC)    Diabetes mellitus (HCC) 12/01/2017   Diabetes mellitus without complication (HCC)    Essential (primary) hypertension    Hyperlipidemia    Hypertension    Hypertriglyceridemia 12/01/2017   Myocardial infarction (HCC)    Polycythemia, secondary 06/03/2021   Spinal stenosis 12/01/2017   Type 2 diabetes mellitus without complications (HCC)    Ulcer 2010   Bleeding   Vitamin D deficiency    Xerosis cutis    Social History[1] Allergies[2]    ROS Per HPI.    Objective:     BP  123/80   Pulse 78   Temp 97.9 F (36.6 C) (Oral)   Resp 16   Ht 5' 4 (1.626 m)   Wt 122 lb 8 oz (55.6 kg)   SpO2 100%   BMI 21.03 kg/m  BP Readings from Last 3 Encounters:  09/03/24 123/80  06/03/24 126/80  03/03/24 124/81   Wt Readings from Last 3 Encounters:  09/03/24 122 lb 8 oz (55.6 kg)  06/03/24 123 lb 8 oz (56 kg)  03/03/24 118 lb (53.5 kg)   SpO2 Readings from Last 3 Encounters:  09/03/24 100%  06/03/24 97%  03/03/24 95%      Physical Exam Constitutional:      General: She is not in acute distress.    Appearance: Normal appearance. She is not  ill-appearing.  HENT:     Head: Normocephalic and atraumatic.     Nose: Nose normal.  Eyes:     General: No scleral icterus.    Conjunctiva/sclera: Conjunctivae normal.  Cardiovascular:     Rate and Rhythm: Normal rate and regular rhythm.     Heart sounds: Normal heart sounds. No murmur heard.    No friction rub.  Pulmonary:     Effort: Pulmonary effort is normal. No respiratory distress.     Breath sounds: Normal breath sounds. No wheezing, rhonchi or rales.  Musculoskeletal:        General: Normal range of motion.  Skin:    General: Skin is warm and dry.     Coloration: Skin is not jaundiced or pale.  Neurological:     General: No focal deficit present.     Mental Status: She is alert.  Psychiatric:        Mood and Affect: Mood normal.        Behavior: Behavior normal.      No results found for any visits on 09/03/24.  Last CBC Lab Results  Component Value Date   WBC 7.5 06/03/2024   HGB 14.6 06/03/2024   HCT 46.0 06/03/2024   MCV 94 06/03/2024   MCH 29.8 06/03/2024   RDW 13.5 06/03/2024   PLT 223 06/03/2024   Last metabolic panel Lab Results  Component Value Date   GLUCOSE 81 06/03/2024   NA 141 06/03/2024   K 4.3 06/03/2024   CL 102 06/03/2024   CO2 24 06/03/2024   BUN 14 06/03/2024   CREATININE 0.72 06/03/2024   EGFR 93 06/03/2024   CALCIUM  9.7 06/03/2024   PROT 6.5 06/03/2024   ALBUMIN 4.3 06/03/2024   LABGLOB 2.2 06/03/2024   BILITOT 0.3 06/03/2024   ALKPHOS 64 06/03/2024   AST 10 06/03/2024   ALT 8 06/03/2024   ANIONGAP 16 (H) 11/20/2023   Last lipids Lab Results  Component Value Date   CHOL 149 06/03/2024   HDL 69 06/03/2024   LDLCALC 65 06/03/2024   TRIG 79 06/03/2024   CHOLHDL 2.2 06/03/2024   Last hemoglobin A1c Lab Results  Component Value Date   HGBA1C 5.7 (H) 06/03/2024      The ASCVD Risk score (Arnett DK, et al., 2019) failed to calculate for the following reasons:   Risk score cannot be calculated because patient has a  medical history suggesting prior/existing ASCVD   * - Cholesterol units were assumed    Assessment & Plan:   Assessment and Plan    Generalized anxiety disorder Chronic, not at goal. Anxiety is not well controlled with current Zoloft  100 mg regimen. Symptoms include fidgetiness and feeling out of  control. - Initiated Buspar  with titration: one tablet daily for week one, then two tablets daily starting week two. - Sent prescription to [facility]. - Monitor response to Buspar  and adjust as needed.  Chronic obstructive pulmonary disease COPD is well controlled with Incruse Ellipta . No recent lung function testing. - Will schedule nurse visit for pulmonary function tests (PFTs) in April. - Instructed to stop Incruse Ellipta  24 hours before PFTs.  Type 2 diabetes mellitus Diabetes is well controlled with metformin  1000 mg twice daily. Last A1c was 5.7%. - Continue metformin  1000 mg twice daily. - Ordered CBC, CMP, and urine test to check for proteinuria.  Essential hypertension Blood pressure is well controlled. - Continue current regimen.   Hyperlipidemia Cholesterol levels are well controlled with Lipitor 20 mg. - Ordered lipid panel to recheck cholesterol levels. - Continue lipitor 20 mg.  Gastroesophageal reflux disease GERD is managed with Protonix . - Continue Protonix .  Tobacco use disorder Currently smoking five cigarettes per day. Goal to reduce smoking to three cigarettes per day by next visit. - Encouraged reduction to three cigarettes per day by next visit.     HM - Order bone density scan - Welcome to medicare needs to be scheduled.  Orders Placed This Encounter  Procedures   DG Bone Density   Microalbumin / creatinine urine ratio   CBC with Differential/Platelet   Comprehensive metabolic panel with GFR   Lipid panel   Hemoglobin A1c    Return in about 3 months (around 12/02/2024) for Chronic Followup.    Doreatha Offer T Brigetta Beckstrom, PA-C    [1]  Social  History Tobacco Use   Smoking status: Every Day    Current packs/day: 0.50    Average packs/day: 0.5 packs/day for 56.0 years (28.0 ttl pk-yrs)    Types: Cigarettes    Start date: 1970    Passive exposure: Current   Smokeless tobacco: Never  Vaping Use   Vaping status: Never Used  Substance Use Topics   Alcohol use: Not Currently   Drug use: Yes    Types: Marijuana    Comment: once in a while  [2]  Allergies Allergen Reactions   Metoprolol Anaphylaxis   Aspirin Other (See Comments)    Bleeding ulcers   Nsaids Other (See Comments)    Bleeding ulcers   "

## 2024-09-03 NOTE — Patient Instructions (Addendum)
 It was nice to see you today!  To prepare for your pulmonary function testing in April, follow these instructions:  Please stop your incruse ellipta  inhaler 24 hours before the testing.  No smoking for four hours before the test No heavy meals Do not wear any tight clothing, for exercise testing please wear a shirt that buttons up from the front and comfortable shoes  I would like for you to be down to 3 cigarettes per day at most by the next time I see you.  If you have any problems before your next visit feel free to message me via MyChart (minor issues or questions) or call the office, otherwise you may reach out to schedule an office visit.  Thank you! Alexandria Current, PA-C

## 2024-09-04 LAB — CBC WITH DIFFERENTIAL/PLATELET
Basophils Absolute: 0.1 x10E3/uL (ref 0.0–0.2)
Basos: 1 %
EOS (ABSOLUTE): 0.1 x10E3/uL (ref 0.0–0.4)
Eos: 1 %
Hematocrit: 46.7 % — ABNORMAL HIGH (ref 34.0–46.6)
Hemoglobin: 15.2 g/dL (ref 11.1–15.9)
Immature Grans (Abs): 0 x10E3/uL (ref 0.0–0.1)
Immature Granulocytes: 0 %
Lymphocytes Absolute: 2.3 x10E3/uL (ref 0.7–3.1)
Lymphs: 30 %
MCH: 30 pg (ref 26.6–33.0)
MCHC: 32.5 g/dL (ref 31.5–35.7)
MCV: 92 fL (ref 79–97)
Monocytes Absolute: 0.5 x10E3/uL (ref 0.1–0.9)
Monocytes: 7 %
Neutrophils Absolute: 4.6 x10E3/uL (ref 1.4–7.0)
Neutrophils: 61 %
Platelets: 209 x10E3/uL (ref 150–450)
RBC: 5.07 x10E6/uL (ref 3.77–5.28)
RDW: 13 % (ref 11.7–15.4)
WBC: 7.5 x10E3/uL (ref 3.4–10.8)

## 2024-09-04 LAB — LIPID PANEL
Chol/HDL Ratio: 2.4 ratio (ref 0.0–4.4)
Cholesterol, Total: 165 mg/dL (ref 100–199)
HDL: 68 mg/dL
LDL Chol Calc (NIH): 80 mg/dL (ref 0–99)
Triglycerides: 95 mg/dL (ref 0–149)
VLDL Cholesterol Cal: 17 mg/dL (ref 5–40)

## 2024-09-04 LAB — COMPREHENSIVE METABOLIC PANEL WITH GFR
ALT: 16 IU/L (ref 0–32)
AST: 19 IU/L (ref 0–40)
Albumin: 4.3 g/dL (ref 3.9–4.9)
Alkaline Phosphatase: 70 IU/L (ref 49–135)
BUN/Creatinine Ratio: 16 (ref 12–28)
BUN: 14 mg/dL (ref 8–27)
Bilirubin Total: 0.3 mg/dL (ref 0.0–1.2)
CO2: 22 mmol/L (ref 20–29)
Calcium: 9.5 mg/dL (ref 8.7–10.3)
Chloride: 102 mmol/L (ref 96–106)
Creatinine, Ser: 0.85 mg/dL (ref 0.57–1.00)
Globulin, Total: 2.2 g/dL (ref 1.5–4.5)
Glucose: 77 mg/dL (ref 70–99)
Potassium: 4.3 mmol/L (ref 3.5–5.2)
Sodium: 140 mmol/L (ref 134–144)
Total Protein: 6.5 g/dL (ref 6.0–8.5)
eGFR: 76 mL/min/1.73

## 2024-09-04 LAB — MICROALBUMIN / CREATININE URINE RATIO
Creatinine, Urine: 68.5 mg/dL
Microalb/Creat Ratio: 22 mg/g{creat} (ref 0–29)
Microalbumin, Urine: 15.3 ug/mL

## 2024-09-04 LAB — HEMOGLOBIN A1C
Est. average glucose Bld gHb Est-mCnc: 114 mg/dL
Hgb A1c MFr Bld: 5.6 % (ref 4.8–5.6)

## 2024-09-06 ENCOUNTER — Ambulatory Visit (HOSPITAL_BASED_OUTPATIENT_CLINIC_OR_DEPARTMENT_OTHER): Payer: Self-pay | Admitting: Student

## 2024-09-24 ENCOUNTER — Other Ambulatory Visit (HOSPITAL_BASED_OUTPATIENT_CLINIC_OR_DEPARTMENT_OTHER): Admitting: Radiology

## 2024-10-01 ENCOUNTER — Other Ambulatory Visit (HOSPITAL_BASED_OUTPATIENT_CLINIC_OR_DEPARTMENT_OTHER): Admitting: Radiology

## 2024-12-02 ENCOUNTER — Ambulatory Visit (HOSPITAL_BASED_OUTPATIENT_CLINIC_OR_DEPARTMENT_OTHER): Admitting: Student
